# Patient Record
Sex: Female | Born: 1950 | Hispanic: Yes | Marital: Single | Smoking: Never smoker
Health system: Southern US, Community
[De-identification: ages and names within clinical notes are randomized; demographics above are authoritative.]

## PROBLEM LIST (undated history)

## (undated) DIAGNOSIS — N390 Urinary tract infection, site not specified: Secondary | ICD-10-CM

## (undated) DIAGNOSIS — F32A Depression, unspecified: Secondary | ICD-10-CM

## (undated) DIAGNOSIS — Z923 Personal history of irradiation: Secondary | ICD-10-CM

## (undated) DIAGNOSIS — I10 Essential (primary) hypertension: Secondary | ICD-10-CM

## (undated) DIAGNOSIS — C801 Malignant (primary) neoplasm, unspecified: Secondary | ICD-10-CM

## (undated) DIAGNOSIS — Z780 Asymptomatic menopausal state: Secondary | ICD-10-CM

## (undated) DIAGNOSIS — R519 Headache, unspecified: Secondary | ICD-10-CM

## (undated) DIAGNOSIS — M1990 Unspecified osteoarthritis, unspecified site: Secondary | ICD-10-CM

## (undated) DIAGNOSIS — Z853 Personal history of malignant neoplasm of breast: Secondary | ICD-10-CM

## (undated) DIAGNOSIS — F329 Major depressive disorder, single episode, unspecified: Secondary | ICD-10-CM

## (undated) DIAGNOSIS — R51 Headache: Secondary | ICD-10-CM

## (undated) DIAGNOSIS — K219 Gastro-esophageal reflux disease without esophagitis: Secondary | ICD-10-CM

## (undated) HISTORY — DX: Essential (primary) hypertension: I10

## (undated) HISTORY — DX: Unspecified osteoarthritis, unspecified site: M19.90

## (undated) HISTORY — DX: Major depressive disorder, single episode, unspecified: F32.9

## (undated) HISTORY — DX: Gastro-esophageal reflux disease without esophagitis: K21.9

## (undated) HISTORY — DX: Depression, unspecified: F32.A

## (undated) HISTORY — PX: TUBAL LIGATION: SHX77

## (undated) HISTORY — DX: Asymptomatic menopausal state: Z78.0

## (undated) HISTORY — DX: Urinary tract infection, site not specified: N39.0

---

## 2005-03-26 HISTORY — PX: COLONOSCOPY: SHX174

## 2007-04-26 ENCOUNTER — Emergency Department: Payer: Self-pay | Admitting: Emergency Medicine

## 2007-04-26 ENCOUNTER — Other Ambulatory Visit: Payer: Self-pay

## 2007-05-21 ENCOUNTER — Ambulatory Visit: Payer: Self-pay

## 2007-05-23 ENCOUNTER — Ambulatory Visit: Payer: Self-pay

## 2010-02-22 ENCOUNTER — Ambulatory Visit: Payer: Self-pay | Admitting: Family

## 2010-08-04 ENCOUNTER — Ambulatory Visit: Payer: Self-pay | Admitting: Family

## 2012-09-03 ENCOUNTER — Ambulatory Visit: Payer: Self-pay

## 2013-10-19 ENCOUNTER — Ambulatory Visit: Payer: Self-pay

## 2014-05-28 DIAGNOSIS — M1712 Unilateral primary osteoarthritis, left knee: Secondary | ICD-10-CM | POA: Insufficient documentation

## 2015-09-29 ENCOUNTER — Other Ambulatory Visit: Payer: Self-pay | Admitting: Primary Care

## 2015-09-29 DIAGNOSIS — Z1231 Encounter for screening mammogram for malignant neoplasm of breast: Secondary | ICD-10-CM

## 2015-09-29 DIAGNOSIS — Z139 Encounter for screening, unspecified: Secondary | ICD-10-CM

## 2015-10-24 ENCOUNTER — Ambulatory Visit: Payer: Self-pay

## 2015-10-25 ENCOUNTER — Ambulatory Visit
Admission: RE | Admit: 2015-10-25 | Discharge: 2015-10-25 | Disposition: A | Discharge status: Home / Self Care | Payer: Medicare Other | Source: Ambulatory Visit | Attending: Primary Care | Admitting: Primary Care

## 2015-10-25 ENCOUNTER — Other Ambulatory Visit: Payer: Self-pay | Admitting: Primary Care

## 2015-10-25 DIAGNOSIS — R928 Other abnormal and inconclusive findings on diagnostic imaging of breast: Secondary | ICD-10-CM | POA: Diagnosis not present

## 2015-10-25 DIAGNOSIS — M810 Age-related osteoporosis without current pathological fracture: Secondary | ICD-10-CM | POA: Insufficient documentation

## 2015-10-25 DIAGNOSIS — Z780 Asymptomatic menopausal state: Secondary | ICD-10-CM | POA: Diagnosis not present

## 2015-10-25 DIAGNOSIS — Z1231 Encounter for screening mammogram for malignant neoplasm of breast: Secondary | ICD-10-CM | POA: Insufficient documentation

## 2015-10-25 DIAGNOSIS — Z139 Encounter for screening, unspecified: Secondary | ICD-10-CM | POA: Diagnosis present

## 2015-10-25 DIAGNOSIS — Z13820 Encounter for screening for osteoporosis: Secondary | ICD-10-CM | POA: Diagnosis not present

## 2015-10-26 ENCOUNTER — Other Ambulatory Visit: Payer: Self-pay | Admitting: Primary Care

## 2015-10-26 DIAGNOSIS — N6320 Unspecified lump in the left breast, unspecified quadrant: Secondary | ICD-10-CM

## 2015-11-01 ENCOUNTER — Ambulatory Visit
Admission: RE | Admit: 2015-11-01 | Discharge: 2015-11-01 | Disposition: A | Discharge status: Home / Self Care | Payer: Medicare Other | Source: Ambulatory Visit | Attending: Primary Care | Admitting: Primary Care

## 2015-11-01 DIAGNOSIS — R928 Other abnormal and inconclusive findings on diagnostic imaging of breast: Secondary | ICD-10-CM | POA: Diagnosis present

## 2015-11-01 DIAGNOSIS — N6320 Unspecified lump in the left breast, unspecified quadrant: Secondary | ICD-10-CM

## 2015-11-04 ENCOUNTER — Other Ambulatory Visit: Payer: Self-pay | Admitting: Family Medicine

## 2015-11-04 DIAGNOSIS — N6320 Unspecified lump in the left breast, unspecified quadrant: Secondary | ICD-10-CM

## 2015-11-09 ENCOUNTER — Ambulatory Visit
Admission: RE | Admit: 2015-11-09 | Discharge: 2015-11-09 | Disposition: A | Discharge status: Home / Self Care | Payer: Medicare Other | Source: Ambulatory Visit | Attending: Family Medicine | Admitting: Family Medicine

## 2015-11-09 DIAGNOSIS — N6320 Unspecified lump in the left breast, unspecified quadrant: Secondary | ICD-10-CM

## 2015-11-09 DIAGNOSIS — C50912 Malignant neoplasm of unspecified site of left female breast: Secondary | ICD-10-CM | POA: Diagnosis not present

## 2015-11-09 DIAGNOSIS — N63 Unspecified lump in breast: Secondary | ICD-10-CM | POA: Diagnosis present

## 2015-11-15 NOTE — Progress Notes (Signed)
  Oncology Nurse Navigator Documentation  Navigator Location: [facility]-Med Onc (11/15/15 0900) Navigator Encounter Type: Introductory phone call;Telephone (11/15/15 0900)   Abnormal Finding Date: 11/01/15 (11/15/15 0900) Confirmed Diagnosis Date: 11/09/15 (11/15/15 0900)     Patient Visit Type: Initial (11/15/15 0900) Treatment Phase: Pre-Tx/Tx Discussion (11/15/15 0900) Barriers/Navigation Needs: Coordination of Care (Language) (11/15/15 0900)   Interventions: Education Method;Coordination of Care;Referrals (Interpreter) (11/15/15 0900)                      Time Spent with Patient: 75 (11/15/15 0900)  Newly diagnosed patient. Via interpreter scheduled Med Onc/surgical consult for 11/17/15.  Spoke with son who will accompany patient to appointments.  Maritza Afandor and [employer] interpreted phone calls.

## 2015-11-16 DIAGNOSIS — C50412 Malignant neoplasm of upper-outer quadrant of left female breast: Secondary | ICD-10-CM | POA: Insufficient documentation

## 2015-11-16 NOTE — Progress Notes (Signed)
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  INTERVAL HISTORY: Patient is a 65 year old female who was noted to have an abnormality on routine screening mammogram. Subsequent ultrasound and biopsy revealed the above stated risk cancer. Patient is Spanish-speaking only and the entire visit was done and presence of an interpreter. She currently feels well and is asymptomatic. She has some residual breast tenderness from her biopsy. She has no neurologic complaints. She denies any recent fevers or illnesses. She has a good appetite and denies weight loss. She denies any chest pain or shortness of breath. She denies any nausea, vomiting, constipation, or diarrhea. She has no urinary complaints. Patient otherwise feels well and offers no further specific complaints.  REVIEW OF SYSTEMS:   Review of Systems  Constitutional: Negative.  Negative for fever, malaise/fatigue and weight loss.  Respiratory: Negative.  Negative for cough and shortness of breath.   Cardiovascular: Negative.  Negative for chest pain.  Gastrointestinal: Negative.  Negative for abdominal pain.  Genitourinary: Negative.   Musculoskeletal: Negative.   Neurological: Negative.  Negative for weakness.  Psychiatric/Behavioral: Negative.     As per HPI. Otherwise, a complete review of systems is negatve.  PAST MEDICAL HISTORY: Past Medical History:  Diagnosis Date  . Arthritis   . Depression   . GERD (gastroesophageal reflux disease)   . Hypertension   . Post-menopausal   . UTI (urinary tract infection)     PAST SURGICAL HISTORY: Past Surgical History:  Procedure Laterality Date  . COLONOSCOPY  2007  . TUBAL  LIGATION      FAMILY HISTORY: Reviewed and unchanged. No reported history of malignancy or chronic disease.     ADVANCED DIRECTIVES (Y/N):  N   HEALTH MAINTENANCE: Social History  Substance Use Topics  . Smoking status: Never Smoker  . Smokeless tobacco: Not on file  . Alcohol use Not on file     Colonoscopy:  PAP:  Bone density:  Lipid panel:  Allergies not on file  Current Outpatient Prescriptions  Medication Sig Dispense Refill  . bisoprolol-hydrochlorothiazide (ZIAC) 10-6.25 MG tablet Take 1 tablet by mouth 2 (two) times daily.    [name] ibuprofen (ADVIL,MOTRIN) 200 MG tablet Take 1 tablet by mouth every 8 (eight) hours as needed.    . simvastatin (ZOCOR) 20 MG tablet Take 1 tablet by mouth daily.     No current facility-administered medications for this visit.     OBJECTIVE: Vitals:   11/17/15 0845  BP: (!) 150/89  Pulse: 80  Resp: 18  Temp: (!) 96.6 F (35.9 C)     Body mass index is 32.09 kg/m.    ECOG FS:0 - Asymptomatic  General: Well-developed, well-nourished, no acute distress. Eyes: Pink conjunctiva, anicteric sclera. HEENT: Normocephalic, moist mucous membranes, clear oropharnyx. Breasts: Patient requested exam be deferred today. Lungs: Clear to auscultation bilaterally. Heart: Regular rate and rhythm. No rubs, murmurs, or gallops. Abdomen: Soft, nontender, nondistended. No organomegaly noted, normoactive bowel sounds. Musculoskeletal: No edema, cyanosis, or clubbing. Neuro: Alert, answering all questions appropriately. Cranial nerves grossly intact. Skin: No rashes or petechiae noted. Psych: Normal affect. Lymphatics: No cervical, calvicular, axillary or inguinal LAD.   LAB RESULTS:  No results found for: NA, K, CL, CO2, GLUCOSE,  BUN, CREATININE, CALCIUM, PROT, ALBUMIN, AST, ALT, ALKPHOS, BILITOT, GFRNONAA, GFRAA  No results found for: WBC, NEUTROABS, HGB, HCT, MCV, PLT   STUDIES: Dg Bone Density  Result Date: 10/25/2015 EXAM: DUAL X-RAY  ABSORPTIOMETRY (DXA) FOR BONE MINERAL DENSITY IMPRESSION: Dear Dr. Freddy Finner, Your patient Jasmine Gardner completed a BMD test on 10/25/2015 using the [place] (analysis version: 14.10) manufactured by [employer]. The following summarizes the results of our evaluation. PATIENT BIOGRAPHICAL: Name: Jasmine, Gardner Patient ID: [id number] Birth Date: 14-Mar-1951 Height: 60.0 in. Gender: Female Exam Date: 10/25/2015 Weight: 163.1 lbs. Indications: Postmenopausal Fractures: Treatments: CALCIUM VIT D ASSESSMENT: The BMD measured at AP Spine L1-L4 is 0.870 g/cm2 with a T-score of -2.6. This patient is considered osteoporotic according to [facility]) criteria. Site Region Measured Measured WHO Young Adult BMD Date       Age      Classification T-score AP Spine L1-L4 10/25/[age] 66.2 Osteoporosis -2.6 0.870 g/cm2 DualFemur Neck Right 10/25/2015 66.3 Osteopenia -1.4 0.839 g/cm2 World Health Organization [facility]) criteria for post-menopausal, Caucasian Women: Normal:       T-score at or above -1 SD Osteopenia:   T-score between -1 and -2.5 SD Osteoporosis: T-score at or below -2.5 SD RECOMMENDATIONS: [place] recommends that FDA-approved medical therapies be considered in postmenopausal women and men age 31 or older with a: 1. Hip or vertebral (clinical or morphometric) fracture. 2. T-score of < -2.5 at the spine or hip. 3. Ten-year fracture probability by FRAX of 3% or greater for hip fracture or 20% or greater for major osteoporotic fracture. All treatment decisions require clinical judgment and consideration of individual patient factors, including patient preferences, co-morbidities, previous drug use, risk factors not captured in the FRAX model (e.g. falls, vitamin D deficiency, increased bone turnover, interval significant decline in bone density) and possible under - or over-estimation of fracture risk by FRAX. All patients should ensure an  adequate intake of dietary calcium (1200 mg/d) and vitamin D (800 IU daily) unless contraindicated. FOLLOW-UP: People with diagnosed cases of osteoporosis or at high risk for fracture should have regular bone mineral density tests. For patients eligible for Medicare, routine testing is allowed once every 2 years. The testing frequency can be increased to one year for patients who have rapidly progressing disease, those who are receiving or discontinuing medical therapy to restore bone mass, or have additional risk factors. I have reviewed this report, and agree with the above findings. [facility] Radiology Electronically Signed   By: Nolon Nations M.D.   On: 10/25/2015 09:54   US Breast Ltd Uni Left Inc Axilla  Result Date: 11/01/2015 CLINICAL DATA:  Evaluate left breast mass noted on mammography. EXAM: ULTRASOUND OF THE LEFT BREAST COMPARISON:  Previous exam(s). FINDINGS: On physical exam, there is no discrete palpable abnormality in the area of mammographic concern. Targeted ultrasound is performed, showing a round, slightly irregular hypoechoic mass located within the left breast at 2:30 o'clock position 8 cm from the nipple measuring 8 x 8 x 8 mm in size. This is a suspicious mass and tissue sampling via ultrasound-guided core biopsy is recommended. Ultrasound of the left axilla demonstrates normal axillary contents and no evidence for adenopathy. IMPRESSION: 8 mm suspicious mass located within the left breast at the 2:30 o'clock position 8 cm from the nipple. Ultrasound-guided core biopsy is recommended. RECOMMENDATION: Left breast ultrasound-guided core biopsy. I have discussed the findings and recommendations with the patient. Results were also provided in  writing at the conclusion of the visit. If applicable, a reminder letter will be sent to the patient regarding the next appointment. BI-RADS CATEGORY  4: Suspicious. Electronically Signed   By: Altamese Cabal M.D.   On: 11/01/2015 12:07   Mm  Screening Breast Tomo Bilateral  Result Date: 10/25/2015 CLINICAL DATA:  Screening. EXAM: 2D DIGITAL SCREENING BILATERAL MAMMOGRAM WITH CAD AND ADJUNCT TOMO COMPARISON:  Previous exam(s). ACR Breast Density Category b: There are scattered areas of fibroglandular density. FINDINGS: In the left breast, a possible mass warrants further evaluation. In the right breast, no findings suspicious for malignancy. Images were processed with CAD. IMPRESSION: Further evaluation is suggested for possible mass in the left breast. RECOMMENDATION: Ultrasound of the left breast. (Code:[id number]-L-39M) The patient will be contacted regarding the findings, and additional imaging will be scheduled. BI-RADS CATEGORY  0: Incomplete. Need additional imaging evaluation and/or prior mammograms for comparison. Electronically Signed   By: Abelardo Diesel M.D.   On: 10/25/2015 13:27   Mm Clip Placement Left  Result Date: 11/09/2015 CLINICAL DATA:  Status post ultrasound-guided left breast biopsy. EXAM: DIAGNOSTIC LEFT MAMMOGRAM POST ULTRASOUND BIOPSY COMPARISON:  Previous exam(s). FINDINGS: Mammographic images were obtained following ultrasound guided biopsy of an indeterminate left breast mass at 2:30, 8 cm from the nipple. Post biopsy mammogram demonstrates the coil shaped biopsy marker to be within the mass in the upper, outer left breast. IMPRESSION: Appropriate marker position as above. Final Assessment: Post Procedure Mammograms for Marker Placement Electronically Signed   By: Pamelia Hoit M.D.   On: 11/09/2015 08:14   [place] Lt Breast Bx W Loc Dev 1st Lesion Img Bx Spec US Guide  Addendum Date: 11/15/2015   ADDENDUM REPORT: 11/15/2015 11:32 ADDENDUM: Pathology of the left breast biopsy revealed LEFT BREAST, 2:30, 8 CMFN; BIOPSY: INVASIVE MAMMARY CARCINOMA OF NO SPECIAL TYPE. PRELIMINARY GRADE: 2 ([place] HISTOLOGIC GRADE). Note: ER, PR and HER2-neu immunohistochemistry is obtained and results will be issued in an addendum. HER2 FISH will be  performed for equivocal results. Final histologic grade should be based on the excised tumor. Results were called to [facility] in the Radiology Dept. at 236 pm on 11/10/15. This was found to be concordant by Dr. Brigitte Pulse. Recommendations:  Surgical and oncology referral. At the patient's request, pathology and recommendations were relayed to the patient by Dr. Theda Sers with the [facility] interpreter. The patient stated she had done well following the biopsy. All of her questions were answered. She was informed that the nurse navigators from [facility] would contact her with referral information. She was encouraged to call the [facility] with any further questions or concerns. Request for referral was relayed to Al Pimple, RN, nurse navigator by Jetta Lout, [place] on 11/11/15. An oncology appointment was made with Dr. Grayland Ormond on 11/17/15 at 8:30 AM and surgical appointment with Dr. Tamala Julian for 11/17/15 at 12:45 PM. The patient has been notified with referral information. Addendum by Jetta Lout, RRA on 11/15/15. Electronically Signed   By: Ammie Ferrier M.D.   On: 11/15/2015 11:32   Result Date: 11/15/2015 CLINICAL DATA:  65 year old female for ultrasound-guided left breast biopsy EXAM: ULTRASOUND GUIDED LEFT BREAST CORE NEEDLE BIOPSY COMPARISON:  Previous exam(s). FINDINGS: I met with the patient and we discussed the procedure of ultrasound-guided biopsy, including benefits and alternatives. We discussed the high likelihood of a successful procedure. We discussed the risks of the procedure, including infection, bleeding, tissue injury, clip migration, and inadequate sampling. Informed  written consent was given. The usual time-out protocol was performed immediately prior to the procedure. Using sterile technique and 1% Lidocaine as local anesthetic, under direct ultrasound visualization, a 12 gauge spring-loaded device was used to perform biopsy of an indeterminate  left breast mass at 2:30, 8 cm from the nipple using a lateral to medial approach. At the conclusion of the procedure a coil shaped tissue marker clip was deployed into the biopsy cavity. Follow up 2 view mammogram was performed and dictated separately. IMPRESSION: Ultrasound guided biopsy of an indeterminate left breast mass at 2:30, 8 cm from the nipple. No apparent complications. Electronically Signed: By: Pamelia Hoit M.D. 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  PLAN:    1. 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 Patient has an appointment with surgery later today. It is unclear whether patient will require adjuvant chemotherapy, but Oncotype DX has been ordered from her biopsy specimen. Patient will return to clinic one to 2 weeks after her surgery to discuss the final pathology results and additional treatment planning. She will also likely have an appointment with radiation oncology at that time. Finally, given ER/PR status of the patient's tumor she will require either tamoxifen or an aromatase inhibitor for 5 years. 2. Osteoporosis: Patient's bone mineral density on October 25, 2015 revealed a T score of -2.6 which is considered osteoporotic. Although the recommendation for postmenopausal women is an aromatase inhibitor, this puts her at increased risk for osteoporosis therefore she may be placed on tamoxifen for 5 years.  Approximately 45 minutes was spent in discussion of which greater than 50% was consultation.  The entire visit was not done in the presence of an interpreter.  Patient expressed understanding and was in agreement with this plan. She also understands that She can call clinic at any time with any  questions, concerns, or complaints.   Primary cancer of upper outer quadrant of left female breast [facility])   Staging form: Breast, AJCC 7th Edition   - Clinical stage from 11/16/2015: Stage IA (T1b, N0, M0) - Signed by Lloyd Huger, MD on 11/17/2015  Lloyd Huger, MD   11/17/2015 10:33 AM

## 2015-11-17 ENCOUNTER — Encounter (INDEPENDENT_AMBULATORY_CARE_PROVIDER_SITE_OTHER): Payer: Self-pay

## 2015-11-17 ENCOUNTER — Encounter: Payer: Self-pay | Admitting: Oncology

## 2015-11-17 ENCOUNTER — Inpatient Hospital Stay: Payer: Medicare Other | Attending: Oncology | Admitting: Oncology

## 2015-11-17 DIAGNOSIS — Z170 Estrogen receptor positive status [ER+]: Secondary | ICD-10-CM | POA: Diagnosis not present

## 2015-11-17 DIAGNOSIS — C50412 Malignant neoplasm of upper-outer quadrant of left female breast: Secondary | ICD-10-CM | POA: Insufficient documentation

## 2015-11-17 DIAGNOSIS — M818 Other osteoporosis without current pathological fracture: Secondary | ICD-10-CM | POA: Insufficient documentation

## 2015-11-17 DIAGNOSIS — I10 Essential (primary) hypertension: Secondary | ICD-10-CM

## 2015-11-17 DIAGNOSIS — Z87442 Personal history of urinary calculi: Secondary | ICD-10-CM

## 2015-11-17 DIAGNOSIS — Z780 Asymptomatic menopausal state: Secondary | ICD-10-CM | POA: Diagnosis not present

## 2015-11-17 DIAGNOSIS — M129 Arthropathy, unspecified: Secondary | ICD-10-CM | POA: Diagnosis not present

## 2015-11-17 DIAGNOSIS — K219 Gastro-esophageal reflux disease without esophagitis: Secondary | ICD-10-CM | POA: Diagnosis not present

## 2015-11-17 DIAGNOSIS — F329 Major depressive disorder, single episode, unspecified: Secondary | ICD-10-CM | POA: Insufficient documentation

## 2015-11-17 DIAGNOSIS — Z79899 Other long term (current) drug therapy: Secondary | ICD-10-CM | POA: Insufficient documentation

## 2015-11-17 LAB — SURGICAL PATHOLOGY

## 2015-11-17 NOTE — Progress Notes (Signed)
New referral for breast cancer from [facility]. Offers no complaints. States is feeling well.

## 2015-11-18 ENCOUNTER — Other Ambulatory Visit: Payer: Self-pay | Admitting: Surgery

## 2015-11-18 DIAGNOSIS — C50912 Malignant neoplasm of unspecified site of left female breast: Secondary | ICD-10-CM

## 2015-11-18 DIAGNOSIS — C50412 Malignant neoplasm of upper-outer quadrant of left female breast: Secondary | ICD-10-CM

## 2015-11-21 ENCOUNTER — Encounter
Admission: RE | Admit: 2015-11-21 | Discharge: 2015-11-21 | Disposition: A | Discharge status: Home / Self Care | Payer: Medicare Other | Source: Ambulatory Visit | Attending: Surgery | Admitting: Surgery

## 2015-11-21 DIAGNOSIS — Z01810 Encounter for preprocedural cardiovascular examination: Secondary | ICD-10-CM | POA: Insufficient documentation

## 2015-11-21 DIAGNOSIS — C50412 Malignant neoplasm of upper-outer quadrant of left female breast: Secondary | ICD-10-CM | POA: Insufficient documentation

## 2015-11-21 HISTORY — DX: Headache: R51

## 2015-11-21 HISTORY — DX: Headache, unspecified: R51.9

## 2015-11-21 NOTE — Patient Instructions (Signed)
Your procedure is scheduled on: Friday 11/25/15 Su procedimiento est programado para: Report to [place] [facility].   Remember: Instructions that are not followed completely may result in serious medical risk, up to and including death, or upon the discretion of your surgeon and anesthesiologist your surgery may need to be rescheduled.  Recuerde: Las instrucciones que no se siguen completamente Heritage manager en un riesgo de salud grave, incluyendo hasta la [place] o a discrecin de su cirujano y Environmental health practitioner, su ciruga se puede posponer.   __X__ 1. Do not eat food or drink liquids after midnight. No gum chewing or hard candies.  No coma alimentos ni tome lquidos despus de la medianoche.  No mastique chicle ni caramelos  duros.     __X__ 2. No alcohol for 24 hours before or after surgery.    No tome alcohol durante las 24 horas antes ni despus de la [place].   ____ 3. Bring all medications with you on the day of surgery if instructed.    Lleve todos los medicamentos con usted el da de su ciruga si se le ha indicado as.   __X__ 4. Notify your doctor if there is any change in your medical condition (cold, fever,                             infections).    Informe a su mdico si hay algn cambio en su condicin mdica (resfriado, fiebre, infecciones).   Do not wear jewelry, make-up, hairpins, clips or nail polish.  No use joyas, maquillajes, pinzas/ganchos para el cabello ni esmalte de uas.  Do not wear lotions, powders, or perfumes. .  No use lociones, polvos o perfumes.  .    Do not shave 48 hours prior to surgery. Men may shave face and neck.  No se afeite 48 horas antes de la [place].  Los hombres pueden [employer] cara y el cuello.   Do not bring valuables to the hospital.   No lleve objetos [place] is not responsible for any belongings or valuables.  [place] no se hace responsable de ningn tipo de pertenencias u  objetos de Geographical information systems officer.               Contacts, dentures or bridgework may not be worn into surgery.  Los lentes de [place], las dentaduras postizas o puentes no se pueden usar en la [place].  Leave your suitcase in the car. After surgery it may be brought to your room.  Deje su maleta en el auto.  Despus de la ciruga podr traerla a su habitacin.  For patients admitted to the hospital, discharge time is determined by your treatment team.  Para los pacientes que sean ingresados al hospital, el tiempo en el cual se le dar de alta es determinado por su                equipo de [place].   Patients discharged the day of surgery will not be allowed to drive home. A los pacientes que se les da de alta el mismo da de la ciruga no se les permitir conducir a Holiday representative.   Please read over the following fact sheets that you were given: Por favor [place] informacin que le dieron:   Surgical Site Infection Prevention   __X__ Take these medicines the morning of surgery with A SIP OF WATER:  Tome estas medicinas la maana de la ciruga con UN SORBO DE AGUA:  1. AMLODIPINE  2. LORATADINE  3. SERTRALINE  4.       5.  6.  ____ Fleet Enema (as directed)          Enema de [place] (segn lo indicado)    __X__ Use CHG Soap as directed          Utilice el jabn de CHG segn lo indicado  ____ Use inhalers on the day of surgery          Use los inhaladores el da de la ciruga  ____ Stop metformin 2 days prior to surgery          Deje de tomar el metformin 2 das antes de la ciruga    ____ Take 1/2 of usual insulin dose the night before surgery and none on the morning of surgery           Tome la mitad de la dosis habitual de insulina la noche antes de la [place] y no tome nada en la maana de la             ciruga  ____ Stop Coumadin/Plavix/aspirin on           Deje de tomar el Coumadin/Plavix/aspirina el da:  __X__ Stop Anti-inflammatories on TODAY IBUPROFEN,  ADVIL, ALEVE          Deje de tomar antiinflamatorios el da:   ____ Stop supplements until after surgery            Deje de tomar suplementos hasta despus de la ciruga  ____ Bring C-Pap to the hospital          [place] al hospital

## 2015-11-22 NOTE — Progress Notes (Signed)
  Oncology Nurse Navigator Documentation  Navigator Location: [facility]-Med Onc (11/17/15 0920) Navigator Encounter Type: Initial MedOnc (11/17/15 0920)           Patient Visit Type: MedOnc;Initial (11/17/15 0920) Treatment Phase: Pre-Tx/Tx Discussion (11/17/15 0920) Barriers/Navigation Needs: Coordination of Care;Education;Family concerns (11/17/15 0920) Education: Understanding Cancer/ Treatment Options;Coping with Diagnosis/ Prognosis;Preparing for Upcoming Surgery/ Treatment;Newly Diagnosed Cancer Education;Transport During Treatment (11/17/15 0920) Interventions: Education Method (11/17/15 0920)                      Time Spent with Patient: 90 (11/17/15 0920)  Met patient and son at initial Med Onc visit with Dr. Cheri Rous Afanador interpreted.  Son requests transportation/van assist when patient begins radiation.  To call back when surgery scheduled to schedule follow-up appointment with Dr. Adelfa Koh Dr. Baruch Gouty.

## 2015-11-22 NOTE — Progress Notes (Signed)
  Oncology Nurse Navigator Documentation  Navigator Location: [facility] Onc (11/22/15 1500) Navigator Encounter Type: Telephone (11/22/15 1500)               Barriers/Navigation Needs: Coordination of Care (11/22/15 1500)                          Time Spent with Patient: 30 (11/22/15 1500)     Patient is scheduled for surgery on 11/25/15.  Worked with team Finnegan/Chrystal to schedule follow-up in [place]. Patient is scheduled for consult with Dr. Baruch Gouty on 12/12/15 at 1:30, and follow-up with Dr.Finnegan at  2:15.  Notified patient, and requested translation for mailed appointment information.

## 2015-11-25 ENCOUNTER — Ambulatory Visit
Admission: RE | Admit: 2015-11-25 | Discharge: 2015-11-25 | Disposition: A | Discharge status: Home / Self Care | Payer: Medicare Other | Source: Ambulatory Visit | Attending: Surgery | Admitting: Surgery

## 2015-11-25 ENCOUNTER — Ambulatory Visit: Payer: Medicare Other | Admitting: Anesthesiology

## 2015-11-25 ENCOUNTER — Encounter
Admission: RE | Disposition: A | Discharge status: Home / Self Care | Payer: Self-pay | Source: Ambulatory Visit | Attending: Surgery

## 2015-11-25 DIAGNOSIS — C50912 Malignant neoplasm of unspecified site of left female breast: Secondary | ICD-10-CM | POA: Diagnosis present

## 2015-11-25 DIAGNOSIS — Z6832 Body mass index (BMI) 32.0-32.9, adult: Secondary | ICD-10-CM | POA: Diagnosis not present

## 2015-11-25 DIAGNOSIS — Z79899 Other long term (current) drug therapy: Secondary | ICD-10-CM | POA: Diagnosis not present

## 2015-11-25 DIAGNOSIS — D0512 Intraductal carcinoma in situ of left breast: Secondary | ICD-10-CM | POA: Insufficient documentation

## 2015-11-25 DIAGNOSIS — Z170 Estrogen receptor positive status [ER+]: Secondary | ICD-10-CM | POA: Diagnosis not present

## 2015-11-25 DIAGNOSIS — C50412 Malignant neoplasm of upper-outer quadrant of left female breast: Secondary | ICD-10-CM

## 2015-11-25 DIAGNOSIS — G43909 Migraine, unspecified, not intractable, without status migrainosus: Secondary | ICD-10-CM | POA: Insufficient documentation

## 2015-11-25 DIAGNOSIS — E669 Obesity, unspecified: Secondary | ICD-10-CM | POA: Insufficient documentation

## 2015-11-25 DIAGNOSIS — I10 Essential (primary) hypertension: Secondary | ICD-10-CM | POA: Diagnosis not present

## 2015-11-25 HISTORY — PX: SENTINEL NODE BIOPSY: SHX6608

## 2015-11-25 HISTORY — PX: PARTIAL MASTECTOMY WITH NEEDLE LOCALIZATION: SHX6008

## 2015-11-25 SURGERY — PARTIAL MASTECTOMY WITH NEEDLE LOCALIZATION
Anesthesia: General | Laterality: Left

## 2015-11-25 MED ORDER — FAMOTIDINE 20 MG PO TABS
ORAL_TABLET | ORAL | Status: AC
  Filled 2015-11-25: qty 1

## 2015-11-25 MED ORDER — OXYCODONE HCL 5 MG PO TABS: 5.0000 mg | ORAL_TABLET | Freq: Once | ORAL | Status: DC | PRN

## 2015-11-25 MED ORDER — BUPIVACAINE-EPINEPHRINE 0.5% -1:200000 IJ SOLN
INTRAMUSCULAR | Status: DC | PRN
  Administered 2015-11-25: 18 mL

## 2015-11-25 MED ORDER — DEXAMETHASONE SODIUM PHOSPHATE 10 MG/ML IJ SOLN
INTRAMUSCULAR | Status: DC | PRN
  Administered 2015-11-25: 10 mg via INTRAVENOUS

## 2015-11-25 MED ORDER — FENTANYL CITRATE (PF) 100 MCG/2ML IJ SOLN
25.0000 ug | INTRAMUSCULAR | Status: DC | PRN
  Administered 2015-11-25 (×3): 50 ug via INTRAVENOUS

## 2015-11-25 MED ORDER — BUPIVACAINE-EPINEPHRINE (PF) 0.5% -1:200000 IJ SOLN
INTRAMUSCULAR | Status: AC
  Filled 2015-11-25: qty 30

## 2015-11-25 MED ORDER — FAMOTIDINE 20 MG PO TABS
20.0000 mg | ORAL_TABLET | Freq: Once | ORAL | Status: AC
  Administered 2015-11-25: 20 mg via ORAL

## 2015-11-25 MED ORDER — PROMETHAZINE HCL 25 MG/ML IJ SOLN: 6.2500 mg | INTRAMUSCULAR | Status: DC | PRN

## 2015-11-25 MED ORDER — OXYCODONE HCL 5 MG/5ML PO SOLN: 5.0000 mg | Freq: Once | ORAL | Status: DC | PRN

## 2015-11-25 MED ORDER — TECHNETIUM TC 99M SULFUR COLLOID
1.0000 | Freq: Once | INTRAVENOUS | Status: AC | PRN
  Administered 2015-11-25: 1.15 via INTRAVENOUS

## 2015-11-25 MED ORDER — MIDAZOLAM HCL 2 MG/2ML IJ SOLN
INTRAMUSCULAR | Status: DC | PRN
  Administered 2015-11-25: 2 mg via INTRAVENOUS

## 2015-11-25 MED ORDER — MEPERIDINE HCL 25 MG/ML IJ SOLN: 6.2500 mg | INTRAMUSCULAR | Status: DC | PRN

## 2015-11-25 MED ORDER — FENTANYL CITRATE (PF) 100 MCG/2ML IJ SOLN
INTRAMUSCULAR | Status: DC
Start: 2015-11-25 — End: 2015-11-25
  Filled 2015-11-25: qty 2

## 2015-11-25 MED ORDER — ONDANSETRON HCL 4 MG/2ML IJ SOLN
INTRAMUSCULAR | Status: DC | PRN
  Administered 2015-11-25: 4 mg via INTRAVENOUS

## 2015-11-25 MED ORDER — FENTANYL CITRATE (PF) 100 MCG/2ML IJ SOLN
INTRAMUSCULAR | Status: DC | PRN
  Administered 2015-11-25 (×2): 50 ug via INTRAVENOUS

## 2015-11-25 MED ORDER — FENTANYL CITRATE (PF) 100 MCG/2ML IJ SOLN
INTRAMUSCULAR | Status: AC
  Filled 2015-11-25: qty 2

## 2015-11-25 MED ORDER — LIDOCAINE HCL (CARDIAC) 20 MG/ML IV SOLN
INTRAVENOUS | Status: DC | PRN
  Administered 2015-11-25: 80 mg via INTRAVENOUS

## 2015-11-25 MED ORDER — PROPOFOL 10 MG/ML IV BOLUS
INTRAVENOUS | Status: DC | PRN
  Administered 2015-11-25: 140 mg via INTRAVENOUS

## 2015-11-25 MED ORDER — LACTATED RINGERS IV SOLN
INTRAVENOUS | Status: DC
  Administered 2015-11-25: 10:00:00 via INTRAVENOUS

## 2015-11-25 MED ORDER — HYDROCODONE-ACETAMINOPHEN 5-325 MG PO TABS: 1.0000 | ORAL_TABLET | ORAL | 0 refills | Status: DC | PRN

## 2015-11-25 MED ORDER — PHENYLEPHRINE HCL 10 MG/ML IJ SOLN
INTRAMUSCULAR | Status: DC | PRN
  Administered 2015-11-25 (×3): 100 ug via INTRAVENOUS
  Administered 2015-11-25: 200 ug via INTRAVENOUS
  Administered 2015-11-25 (×3): 100 ug via INTRAVENOUS

## 2015-11-25 MED ORDER — GLYCOPYRROLATE 0.2 MG/ML IJ SOLN
INTRAMUSCULAR | Status: DC | PRN
  Administered 2015-11-25: 0.2 mg via INTRAVENOUS

## 2015-11-25 MED ORDER — SODIUM CHLORIDE 0.9 % IV SOLN
INTRAVENOUS | Status: DC | PRN
  Administered 2015-11-25: 30 ug/min via INTRAVENOUS

## 2015-11-25 SURGICAL SUPPLY — 32 items
BLADE SURG 15 STRL LF DISP TIS (BLADE) ×1 IMPLANT
BLADE SURG 15 STRL SS (BLADE) ×2
CANISTER SUCT 1200ML W/VALVE (MISCELLANEOUS) ×3 IMPLANT
CHLORAPREP W/TINT 26ML (MISCELLANEOUS) ×3 IMPLANT
CNTNR SPEC 2.5X3XGRAD LEK (MISCELLANEOUS) ×2
CONT SPEC 4OZ STER OR WHT (MISCELLANEOUS) ×4
CONTAINER SPEC 2.5X3XGRAD LEK (MISCELLANEOUS) ×2 IMPLANT
DEVICE DUBIN SPECIMEN MAMMOGRA (MISCELLANEOUS) ×3 IMPLANT
DRAPE LAPAROTOMY 77X122 PED (DRAPES) ×3 IMPLANT
ELECT REM PT RETURN 9FT ADLT (ELECTROSURGICAL) ×3
ELECTRODE REM PT RTRN 9FT ADLT (ELECTROSURGICAL) ×1 IMPLANT
GLOVE BIO SURGEON STRL SZ7.5 (GLOVE) ×3 IMPLANT
GOWN STRL REUS W/ TWL LRG LVL3 (GOWN DISPOSABLE) ×2 IMPLANT
GOWN STRL REUS W/TWL LRG LVL3 (GOWN DISPOSABLE) ×4
KIT RM TURNOVER STRD PROC AR (KITS) ×3 IMPLANT
LABEL OR SOLS (LABEL) ×3 IMPLANT
LIQUID BAND (GAUZE/BANDAGES/DRESSINGS) ×3 IMPLANT
MARGIN MAP 10MM (MISCELLANEOUS) ×3 IMPLANT
NDL SAFETY 18GX1.5 (NEEDLE) ×3 IMPLANT
NDL SAFETY 22GX1.5 (NEEDLE) ×3 IMPLANT
NEEDLE HYPO 25X1 1.5 SAFETY (NEEDLE) ×3 IMPLANT
PACK BASIN MINOR ARMC (MISCELLANEOUS) ×3 IMPLANT
SLEVE PROBE SENORX GAMMA FIND (MISCELLANEOUS) ×3 IMPLANT
SUT CHROMIC 3 0 SH 27 (SUTURE) ×3 IMPLANT
SUT CHROMIC 4 0 RB 1X27 (SUTURE) ×3 IMPLANT
SUT ETHILON 3-0 FS-10 30 BLK (SUTURE) ×3
SUT MNCRL 4-0 (SUTURE) ×2
SUT MNCRL 4-0 27XMFL (SUTURE) ×1
SUTURE EHLN 3-0 FS-10 30 BLK (SUTURE) ×1 IMPLANT
SUTURE MNCRL 4-0 27XMF (SUTURE) ×1 IMPLANT
SYRINGE 10CC LL (SYRINGE) ×3 IMPLANT
WATER STERILE IRR 1000ML POUR (IV SOLUTION) ×3 IMPLANT

## 2015-11-25 NOTE — Anesthesia Postprocedure Evaluation (Signed)
Anesthesia Post Note  Patient: KRISTY-LEE FLOHR  Procedure(s) Performed: Procedure(s) (LRB): PARTIAL MASTECTOMY WITH NEEDLE LOCALIZATION (Left) SENTINEL NODE BIOPSY (Left)  Patient location during evaluation: PACU Anesthesia Type: General Level of consciousness: awake and alert and oriented Pain management: pain level controlled Vital Signs Assessment: post-procedure vital signs reviewed and stable Respiratory status: spontaneous breathing, nonlabored ventilation and respiratory function stable Cardiovascular status: blood pressure returned to baseline and stable Postop Assessment: no signs of nausea or vomiting Anesthetic complications: no    Last Vitals:  Vitals:   11/25/15 1308 11/25/15 1318  BP: 118/71 123/77  Pulse: 74 81  Resp: (!) 9 10  Temp:      Last Pain:  Vitals:   11/25/15 1305  TempSrc:   PainSc: 6                  Danyeal Akens

## 2015-11-25 NOTE — Progress Notes (Signed)
Interpreter here to go over instructions

## 2015-11-25 NOTE — Anesthesia Preprocedure Evaluation (Signed)
Anesthesia Evaluation  Patient identified by MRN, date of birth, ID band Patient awake    Reviewed: Allergy & Precautions, NPO status , Patient's Chart, lab work & pertinent test results  History of Anesthesia Complications Negative for: history of anesthetic complications  Airway Mallampati: II  TM Distance: >3 FB Neck ROM: Full    Dental  (+) Upper Dentures, Poor Dentition   Pulmonary neg pulmonary ROS, neg sleep apnea, neg COPD,    breath sounds clear to auscultation- rhonchi (-) wheezing      Cardiovascular hypertension, Pt. on medications and Pt. on home beta blockers (-) CAD and (-) Past MI  Rhythm:Regular Rate:Normal - Systolic murmurs and - Diastolic murmurs    Neuro/Psych  Headaches, Depression    GI/Hepatic Neg liver ROS, GERD  ,  Endo/Other  negative endocrine ROSneg diabetes  Renal/GU negative Renal ROS     Musculoskeletal  (+) Arthritis ,   Abdominal (+) + obese,   Peds  Hematology negative hematology ROS (+)   Anesthesia Other Findings Past Medical History: No date: Arthritis No date: Depression No date: GERD (gastroesophageal reflux disease) No date: Headache No date: Hypertension No date: Post-menopausal No date: UTI (urinary tract infection)   Reproductive/Obstetrics                             Anesthesia Physical Anesthesia Plan  ASA: II  Anesthesia Plan: General   Post-op Pain Management:    Induction: Intravenous  Airway Management Planned: LMA  Additional Equipment:   Intra-op Plan:   Post-operative Plan:   Informed Consent: I have reviewed the patients History and Physical, chart, labs and discussed the procedure including the risks, benefits and alternatives for the proposed anesthesia with the patient or authorized representative who has indicated his/her understanding and acceptance.   Dental advisory given  Plan Discussed with: CRNA and  Anesthesiologist  Anesthesia Plan Comments:         Anesthesia Quick Evaluation

## 2015-11-25 NOTE — Anesthesia Procedure Notes (Signed)
Procedure Name: LMA Insertion Date/Time: 11/25/2015 10:28 AM Performed by: Silvana Newness Pre-anesthesia Checklist: Patient identified, Emergency Drugs available, Suction available, Patient being monitored and Timeout performed Patient Re-evaluated:Patient Re-evaluated prior to inductionOxygen Delivery Method: Circle system utilized Preoxygenation: Pre-oxygenation with 100% oxygen Intubation Type: IV induction Ventilation: Mask ventilation without difficulty LMA: LMA inserted LMA Size: 4.0 Number of attempts: 1 Placement Confirmation: positive ETCO2 and breath sounds checked- equal and bilateral Tube secured with: Tape Dental Injury: Teeth and Oropharynx as per pre-operative assessment

## 2015-11-25 NOTE — Op Note (Signed)
OPERATIVE REPORT  PREOPERATIVE  DIAGNOSIS: . Left breast cancer  POSTOPERATIVE DIAGNOSIS: . Left breast cancer  PROCEDURE: . Left partial mastectomy with axillary sentinel lymph node biopsy  ANESTHESIA:  General  SURGEON: Rochel Brome  MD   INDICATIONS: . She had recent mammogram findings of a mass in the lateral aspect of the left breast. Ultrasound guided core biopsy demonstrated infiltrating mammary carcinoma. Surgery was recommended for definitive treatment. She did have preoperative x-ray needle localization. I reviewed the final mammogram images prior to incision. She also had injection of radioactive technetium sulfur colloid.  The patient was placed on the operating table in the supine position under general anesthesia. The left arm was placed on a lateral arm support. The dressing was removed from the lateral aspect of the left breast exposing the Kopan's wire which was cut 2 cm from the skin. The breast and axilla and surrounding chest wall and upper arm were prepared with ChloraPrep and draped in a sterile manner.  A curvilinear incision was made in the upper outer aspect of the left breast approximately 10 cm from the nipple from the 1:00 to 2:00 position. This incision was carried down through subcutaneous tissues to encounter the wire. Small bleeding points were cauterized during the course of the procedure. A mass of tissue surrounding the wire was excised which was approximately 3 x 3 x 4 cm in dimension. This was marked with the margin maps suturing markers to the medial lateral cranial caudal and deep margins. This was submitted fresh for specimen mammogram and pathology. The wound was inspected and several bleeding points cauterized and hemostasis was subsequently intact.  The axilla was probed with a gamma counter demonstrating location of radioactivity. An oblique incision was made in the inferior aspect of the axilla some 5 cm in length and carried down through subcutaneous  tissues and dissected down adjacent to the chest wall. There was a finding of radioactivity and a lymph node and surrounding fatty tissue was excised. The ex vivo count was in the range of 12-20 counts per second.This was submitted as sentinel lymph node #1. 3 clamped bleeding points were suture ligated with 3-0 chromic.  There was another finding of a area of radioactivity and resected another lymph node. The ex vivo count was in the range of 6-12 counts per second and submitted as sentinel lymph node #2 for routine pathology. This second lymph node did contain a suture ligature. Another clamped vessel was suture ligated with 3-0 chromic the wound was inspected and several tiny bleeding points cauterized and hemostasis was subsequently intact.  The radiologist reported that the mass was seen in the specimen mammogram along with the biopsy marker. The pathologist later called to report that the margins appeared to be satisfactory.  The partial mastectomy wound was further inspected and the subcutaneous tissues were infiltrated with half percent Sensorcaine with epinephrine. The wound was closed with a running 4-0 Monocryl subcuticular suture.  The axillary wound was inspected and the subcutaneous tissues were infiltrated with half percent Sensorcaine with epinephrine. The wound was closed with a running 4-0 Monocryl subcutaneous for suture.  Both wounds were then treated with LiquiBand and allowed to dry.

## 2015-11-25 NOTE — Progress Notes (Signed)
  Oncology Nurse Navigator Documentation            Surgery Date: 11/25/15 (11/25/15 1000)   Patient Visit Type: Surgery (pre-op) (11/25/15 1000) Treatment Phase: Active Tx;Treatment (11/25/15 1000)                            Time Spent with Patient: 30 (11/25/15 1000)   Met patient in [facility] Day Surgery with interpreter [facility]  Spoke to son, who speaks [place], in waiting area.  He and his brother drove from [place] last night to be with their mother.  Oriented him to hospital, explained pager system for visiting surgical patients, and Patient also has 2 sons who live in [place].  Patient has a great deal of family support.

## 2015-11-25 NOTE — Transfer of Care (Signed)
Immediate Anesthesia Transfer of Care Note  Patient: Jasmine Gardner  Procedure(s) Performed: Procedure(s): PARTIAL MASTECTOMY WITH NEEDLE LOCALIZATION (Left) SENTINEL NODE BIOPSY (Left)  Patient Location: PACU  Anesthesia Type:General  Level of Consciousness: awake, alert , oriented and patient cooperative  Airway & Oxygen Therapy: Patient Spontanous Breathing and Patient connected to face mask oxygen  Post-op Assessment: Report given to RN, Post -op Vital signs reviewed and stable and Patient moving all extremities X 4  Post vital signs: Reviewed and stable  Last Vitals:  Vitals:   11/25/15 0941  BP: (!) 149/78  Pulse: 69  Resp: 16  Temp: 36.2 C    Last Pain:  Vitals:   11/25/15 0941  TempSrc: Tympanic         Complications: No apparent anesthesia complications

## 2015-11-25 NOTE — H&P (Signed)
  She was interviewed with an interpreter. She reports no change in overall condition since the recent office visit.  She has had x-ray needle localization and injection of technetium sulfur colloid.  I discussed the plan for left partial mastectomy with sentinel lymph node biopsy  The left breast has been marked YES

## 2015-11-25 NOTE — Discharge Instructions (Signed)
Take Tylenol or Norco if needed for pain.  Should not drive or do anything dangerous when taking Norco.  May shower and blot dry.  Were bra as desired for comfort and support.

## 2015-11-28 ENCOUNTER — Encounter: Payer: Self-pay | Admitting: Surgery

## 2015-11-29 LAB — SURGICAL PATHOLOGY

## 2015-11-29 NOTE — Progress Notes (Signed)
  Oncology Nurse Navigator Documentation  Navigator Location: [facility] (11/29/15 1200) Navigator Encounter Type: Telephone (11/29/15 1200)           Patient Visit Type: Post-op Appt (11/29/15 1200) Treatment Phase: Active Tx (11/29/15 1200)                            Time Spent with Patient: 30 (11/29/15 1200)  Patient doing well post-op.  Minimal discomfort.  Sons doing great job as caregivers.  Chip Boer Murr interpreted exam.

## 2015-12-11 NOTE — Progress Notes (Deleted)
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  INTERVAL HISTORY: Patient is a 65 year old female who was noted to have an abnormality on routine screening mammogram. Subsequent ultrasound and biopsy revealed the above stated risk cancer. Patient is Spanish-speaking only and the entire visit was done and presence of an interpreter. She currently feels well and is asymptomatic. She has some residual breast tenderness from her biopsy. She has no neurologic complaints. She denies any recent fevers or illnesses. She has a good appetite and denies weight loss. She denies any chest pain or shortness of breath. She denies any nausea, vomiting, constipation, or diarrhea. She has no urinary complaints. Patient otherwise feels well and offers no further specific complaints.  REVIEW OF SYSTEMS:   Review of Systems  Constitutional: Negative.  Negative for fever, malaise/fatigue and weight loss.  Respiratory: Negative.  Negative for cough and shortness of breath.   Cardiovascular: Negative.  Negative for chest pain.  Gastrointestinal: Negative.  Negative for abdominal pain.  Genitourinary: Negative.   Musculoskeletal: Negative.   Neurological: Negative.  Negative for weakness.  Psychiatric/Behavioral: Negative.     As per HPI. Otherwise, a complete review of systems is negatve.  PAST MEDICAL HISTORY: Past Medical History:  Diagnosis Date  . Arthritis   . Depression   . GERD (gastroesophageal reflux disease)   . Headache   . Hypertension   . Post-menopausal   . UTI (urinary tract infection)     PAST SURGICAL HISTORY: Past Surgical History:  Procedure Laterality Date  . COLONOSCOPY   2007  . PARTIAL MASTECTOMY WITH NEEDLE LOCALIZATION Left 11/25/2015   Procedure: PARTIAL MASTECTOMY WITH NEEDLE LOCALIZATION;  Surgeon: Leonie Green, MD;  Location: [facility] ORS;  Service: General;  Laterality: Left;  . SENTINEL NODE BIOPSY Left 11/25/2015   Procedure: SENTINEL NODE BIOPSY;  Surgeon: Leonie Green, MD;  Location: [facility] ORS;  Service: General;  Laterality: Left;  . TUBAL LIGATION      FAMILY HISTORY: Reviewed and unchanged. No reported history of malignancy or chronic disease.     ADVANCED DIRECTIVES (Y/N):  N   HEALTH MAINTENANCE: Social History  Substance Use Topics  . Smoking status: Never Smoker  . Smokeless tobacco: Never Used  . Alcohol use No     Colonoscopy:  PAP:  Bone density:  Lipid panel:  No Known Allergies  Current Outpatient Prescriptions  Medication Sig Dispense Refill  . amLODipine (NORVASC) 5 MG tablet Take 5 mg by mouth daily.    . bisoprolol-hydrochlorothiazide (ZIAC) 10-6.25 MG tablet Take 1 tablet by mouth 2 (two) times daily.    [name] HYDROcodone-acetaminophen (NORCO) 5-325 MG tablet Take 1-2 tablets by mouth every 4 (four) hours as needed for moderate pain. 12 tablet 0  . ibuprofen (ADVIL,MOTRIN) 200 MG tablet Take 1 tablet by mouth every 8 (eight) hours as needed.    [name] lisinopril (PRINIVIL,ZESTRIL) 40 MG tablet Take 40 mg by mouth at bedtime.    [name] loratadine (CLARITIN) 10 MG tablet Take 10 mg by mouth daily.    . pravastatin (PRAVACHOL) 20 MG tablet Take 20 mg by mouth at bedtime.    . senna (SENOKOT) 8.6 MG TABS tablet Take 1-2 tablets by mouth at bedtime.    [name]  sertraline (ZOLOFT) 25 MG tablet Take 25 mg by mouth daily.     No current facility-administered medications for this visit.     OBJECTIVE: There were no vitals filed for this visit.   There is no height or weight on file to calculate BMI.    ECOG FS:0 - Asymptomatic  General: Well-developed, well-nourished, no acute distress. Eyes: Pink conjunctiva, anicteric  sclera. HEENT: Normocephalic, moist mucous membranes, clear oropharnyx. Breasts: Patient requested exam be deferred today. Lungs: Clear to auscultation bilaterally. Heart: Regular rate and rhythm. No rubs, murmurs, or gallops. Abdomen: Soft, nontender, nondistended. No organomegaly noted, normoactive bowel sounds. Musculoskeletal: No edema, cyanosis, or clubbing. Neuro: Alert, answering all questions appropriately. Cranial nerves grossly intact. Skin: No rashes or petechiae noted. Psych: Normal affect. Lymphatics: No cervical, calvicular, axillary or inguinal LAD.   LAB RESULTS:  No results found for: NA, K, CL, CO2, GLUCOSE, BUN, CREATININE, CALCIUM, PROT, ALBUMIN, AST, ALT, ALKPHOS, BILITOT, GFRNONAA, GFRAA  No results found for: WBC, NEUTROABS, HGB, HCT, MCV, PLT   STUDIES: Nm Sentinel Node Injection  Result Date: 11/25/2015 CLINICAL DATA:  Left breast cancer. EXAM: NUCLEAR MEDICINE BREAST LYMPHOSCINTIGRAPHY LEFT BREAST TECHNIQUE: Intradermal injection of radiopharmaceutical was performed at the 12 o'clock, 3 o'clock, 6 o'clock, and 9 o'clock positions around the left nipple. The patient was then sent to the operating room where the sentinel node(s) were identified and removed by the surgeon. RADIOPHARMACEUTICALS:  Total of 1 mCi Millipore-filtered Technetium-53msulfur colloid, injected into the left periareolar region. IMPRESSION: Successful injection for sentinel node study left breast . Electronically Signed   By: TMarcello Moores Register   On: 11/25/2015 09:51   Mm Breast Surgical Specimen  Result Date: 11/25/2015 CLINICAL DATA:  Status post lumpectomy today after preoperative needle localization. EXAM: SPECIMEN RADIOGRAPH OF THE LEFT BREAST COMPARISON:  Previous exam(s). FINDINGS: Status post excision of the left breast. The wire tip and biopsy marker clip are present and are marked for pathology. IMPRESSION: Specimen radiograph of the left breast. Electronically Signed   By: SFranki Cabot M.D.   On: 11/25/2015 11:44   Mm Lt Plc Breast Loc Dev   1st Lesion  Inc Mammo Guide  Result Date: 11/25/2015 CLINICAL DATA:  Recent biopsy-proven left breast cancer. Patient presents today for preoperative needle localization. EXAM: NEEDLE LOCALIZATION OF THE LEFT BREAST WITH MAMMO GUIDANCE COMPARISON:  Previous exams. FINDINGS: Patient presents for needle localization prior to breast conservation surgery. I met with the patient and we discussed the procedure of needle localization including benefits and alternatives. We discussed the high likelihood of a successful procedure. We discussed the risks of the procedure, including infection, bleeding, tissue injury, and further surgery. Informed, written consent was given. The usual time-out protocol was performed immediately prior to the procedure. Using mammographic guidance, sterile technique, 1% lidocaine and a 7 cm modified Kopans needle, the mass containing a coil shaped clip within the outer left breast localized using lateral approach. The images were marked for Dr. STamala Julian IMPRESSION: Needle localization left breast. No apparent complications. Electronically Signed   By: SFranki CabotM.D.   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  PLAN:    1. 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 Patient has an appointment with surgery later  today. It is unclear whether patient will require adjuvant chemotherapy, but Oncotype DX has been ordered from her biopsy specimen. Patient will return to clinic one to 2 weeks after her surgery to discuss the final pathology results and additional treatment planning. She will also likely have an appointment with radiation oncology at that time.  Finally, given ER/PR status of the patient's tumor she will require either tamoxifen or an aromatase inhibitor for 5 years. 2. Osteoporosis: Patient's bone mineral density on October 25, 2015 revealed a T score of -2.6 which is considered osteoporotic. Although the recommendation for postmenopausal women is an aromatase inhibitor, this puts her at increased risk for osteoporosis therefore she may be placed on tamoxifen for 5 years.  Approximately 45 minutes was spent in discussion of which greater than 50% was consultation.  The entire visit was not done in the presence of an interpreter.  Patient expressed understanding and was in agreement with this plan. She also understands that She can call clinic at any time with any questions, concerns, or complaints.   Primary cancer of upper outer quadrant of left female breast [facility])   Staging form: Breast, AJCC 7th Edition   - Clinical stage from 11/16/2015: Stage IA (T1b, N0, M0) - Signed by Lloyd Huger, MD on 11/17/2015  Lloyd Huger, MD   12/11/2015 10:27 PM

## 2015-12-12 ENCOUNTER — Ambulatory Visit: Payer: Medicare Other | Admitting: Radiation Oncology

## 2015-12-12 ENCOUNTER — Inpatient Hospital Stay: Payer: Medicare Other | Admitting: Oncology

## 2015-12-13 NOTE — Progress Notes (Deleted)
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  INTERVAL HISTORY: Patient is a 65 year old female who was noted to have an abnormality on routine screening mammogram. Subsequent ultrasound and biopsy revealed the above stated risk cancer. Patient is Spanish-speaking only and the entire visit was done and presence of an interpreter. She currently feels well and is asymptomatic. She has some residual breast tenderness from her biopsy. She has no neurologic complaints. She denies any recent fevers or illnesses. She has a good appetite and denies weight loss. She denies any chest pain or shortness of breath. She denies any nausea, vomiting, constipation, or diarrhea. She has no urinary complaints. Patient otherwise feels well and offers no further specific complaints.  REVIEW OF SYSTEMS:   Review of Systems  Constitutional: Negative.  Negative for fever, malaise/fatigue and weight loss.  Respiratory: Negative.  Negative for cough and shortness of breath.   Cardiovascular: Negative.  Negative for chest pain.  Gastrointestinal: Negative.  Negative for abdominal pain.  Genitourinary: Negative.   Musculoskeletal: Negative.   Neurological: Negative.  Negative for weakness.  Psychiatric/Behavioral: Negative.     As per HPI. Otherwise, a complete review of systems is negatve.  PAST MEDICAL HISTORY: Past Medical History:  Diagnosis Date  . Arthritis   . Depression   . GERD (gastroesophageal reflux disease)   . Headache   . Hypertension   . Post-menopausal   . UTI (urinary tract infection)     PAST SURGICAL HISTORY: Past Surgical History:  Procedure Laterality Date  . COLONOSCOPY   2007  . PARTIAL MASTECTOMY WITH NEEDLE LOCALIZATION Left 11/25/2015   Procedure: PARTIAL MASTECTOMY WITH NEEDLE LOCALIZATION;  Surgeon: Leonie Green, MD;  Location: [facility] ORS;  Service: General;  Laterality: Left;  . SENTINEL NODE BIOPSY Left 11/25/2015   Procedure: SENTINEL NODE BIOPSY;  Surgeon: Leonie Green, MD;  Location: [facility] ORS;  Service: General;  Laterality: Left;  . TUBAL LIGATION      FAMILY HISTORY: Reviewed and unchanged. No reported history of malignancy or chronic disease.     ADVANCED DIRECTIVES (Y/N):  N   HEALTH MAINTENANCE: Social History  Substance Use Topics  . Smoking status: Never Smoker  . Smokeless tobacco: Never Used  . Alcohol use No     Colonoscopy:  PAP:  Bone density:  Lipid panel:  No Known Allergies  Current Outpatient Prescriptions  Medication Sig Dispense Refill  . amLODipine (NORVASC) 5 MG tablet Take 5 mg by mouth daily.    . bisoprolol-hydrochlorothiazide (ZIAC) 10-6.25 MG tablet Take 1 tablet by mouth 2 (two) times daily.    [name] HYDROcodone-acetaminophen (NORCO) 5-325 MG tablet Take 1-2 tablets by mouth every 4 (four) hours as needed for moderate pain. 12 tablet 0  . ibuprofen (ADVIL,MOTRIN) 200 MG tablet Take 1 tablet by mouth every 8 (eight) hours as needed.    [name] lisinopril (PRINIVIL,ZESTRIL) 40 MG tablet Take 40 mg by mouth at bedtime.    [name] loratadine (CLARITIN) 10 MG tablet Take 10 mg by mouth daily.    . pravastatin (PRAVACHOL) 20 MG tablet Take 20 mg by mouth at bedtime.    . senna (SENOKOT) 8.6 MG TABS tablet Take 1-2 tablets by mouth at bedtime.    [name]  sertraline (ZOLOFT) 25 MG tablet Take 25 mg by mouth daily.     No current facility-administered medications for this visit.     OBJECTIVE: There were no vitals filed for this visit.   There is no height or weight on file to calculate BMI.    ECOG FS:0 - Asymptomatic  General: Well-developed, well-nourished, no acute distress. Eyes: Pink conjunctiva, anicteric  sclera. HEENT: Normocephalic, moist mucous membranes, clear oropharnyx. Breasts: Patient requested exam be deferred today. Lungs: Clear to auscultation bilaterally. Heart: Regular rate and rhythm. No rubs, murmurs, or gallops. Abdomen: Soft, nontender, nondistended. No organomegaly noted, normoactive bowel sounds. Musculoskeletal: No edema, cyanosis, or clubbing. Neuro: Alert, answering all questions appropriately. Cranial nerves grossly intact. Skin: No rashes or petechiae noted. Psych: Normal affect. Lymphatics: No cervical, calvicular, axillary or inguinal LAD.   LAB RESULTS:  No results found for: NA, K, CL, CO2, GLUCOSE, BUN, CREATININE, CALCIUM, PROT, ALBUMIN, AST, ALT, ALKPHOS, BILITOT, GFRNONAA, GFRAA  No results found for: WBC, NEUTROABS, HGB, HCT, MCV, PLT   STUDIES: Nm Sentinel Node Injection  Result Date: 11/25/2015 CLINICAL DATA:  Left breast cancer. EXAM: NUCLEAR MEDICINE BREAST LYMPHOSCINTIGRAPHY LEFT BREAST TECHNIQUE: Intradermal injection of radiopharmaceutical was performed at the 12 o'clock, 3 o'clock, 6 o'clock, and 9 o'clock positions around the left nipple. The patient was then sent to the operating room where the sentinel node(s) were identified and removed by the surgeon. RADIOPHARMACEUTICALS:  Total of 1 mCi Millipore-filtered Technetium-53msulfur colloid, injected into the left periareolar region. IMPRESSION: Successful injection for sentinel node study left breast . Electronically Signed   By: TMarcello Moores Register   On: 11/25/2015 09:51   Mm Breast Surgical Specimen  Result Date: 11/25/2015 CLINICAL DATA:  Status post lumpectomy today after preoperative needle localization. EXAM: SPECIMEN RADIOGRAPH OF THE LEFT BREAST COMPARISON:  Previous exam(s). FINDINGS: Status post excision of the left breast. The wire tip and biopsy marker clip are present and are marked for pathology. IMPRESSION: Specimen radiograph of the left breast. Electronically Signed   By: SFranki Cabot M.D.   On: 11/25/2015 11:44   Mm Lt Plc Breast Loc Dev   1st Lesion  Inc Mammo Guide  Result Date: 11/25/2015 CLINICAL DATA:  Recent biopsy-proven left breast cancer. Patient presents today for preoperative needle localization. EXAM: NEEDLE LOCALIZATION OF THE LEFT BREAST WITH MAMMO GUIDANCE COMPARISON:  Previous exams. FINDINGS: Patient presents for needle localization prior to breast conservation surgery. I met with the patient and we discussed the procedure of needle localization including benefits and alternatives. We discussed the high likelihood of a successful procedure. We discussed the risks of the procedure, including infection, bleeding, tissue injury, and further surgery. Informed, written consent was given. The usual time-out protocol was performed immediately prior to the procedure. Using mammographic guidance, sterile technique, 1% lidocaine and a 7 cm modified Kopans needle, the mass containing a coil shaped clip within the outer left breast localized using lateral approach. The images were marked for Dr. STamala Julian IMPRESSION: Needle localization left breast. No apparent complications. Electronically Signed   By: SFranki CabotM.D.   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  PLAN:    1. 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 Patient has an appointment with surgery later  today. It is unclear whether patient will require adjuvant chemotherapy, but Oncotype DX has been ordered from her biopsy specimen. Patient will return to clinic one to 2 weeks after her surgery to discuss the final pathology results and additional treatment planning. She will also likely have an appointment with radiation oncology at that time.  Finally, given ER/PR status of the patient's tumor she will require either tamoxifen or an aromatase inhibitor for 5 years. 2. Osteoporosis: Patient's bone mineral density on October 25, 2015 revealed a T score of -2.6 which is considered osteoporotic. Although the recommendation for postmenopausal women is an aromatase inhibitor, this puts her at increased risk for osteoporosis therefore she may be placed on tamoxifen for 5 years.  Approximately 45 minutes was spent in discussion of which greater than 50% was consultation.  The entire visit was not done in the presence of an interpreter.  Patient expressed understanding and was in agreement with this plan. She also understands that She can call clinic at any time with any questions, concerns, or complaints.   Primary cancer of upper outer quadrant of left female breast [facility])   Staging form: Breast, AJCC 7th Edition   - Clinical stage from 11/16/2015: Stage IA (T1b, N0, M0) - Signed by Lloyd Huger, MD on 11/17/2015  Lloyd Huger, MD   12/13/2015 1:12 PM

## 2015-12-15 ENCOUNTER — Inpatient Hospital Stay: Payer: Medicare Other | Admitting: Oncology

## 2015-12-15 ENCOUNTER — Encounter: Payer: Self-pay | Admitting: Radiation Oncology

## 2015-12-15 ENCOUNTER — Ambulatory Visit
Admission: RE | Admit: 2015-12-15 | Discharge: 2015-12-15 | Disposition: A | Discharge status: Home / Self Care | Payer: Medicare Other | Source: Ambulatory Visit | Attending: Radiation Oncology | Admitting: Radiation Oncology

## 2015-12-15 VITALS — BP 132/80 | HR 66 | Temp 97.8°F | Wt 159.8 lb

## 2015-12-15 DIAGNOSIS — K219 Gastro-esophageal reflux disease without esophagitis: Secondary | ICD-10-CM | POA: Insufficient documentation

## 2015-12-15 DIAGNOSIS — M129 Arthropathy, unspecified: Secondary | ICD-10-CM | POA: Diagnosis not present

## 2015-12-15 DIAGNOSIS — Z87440 Personal history of urinary (tract) infections: Secondary | ICD-10-CM | POA: Insufficient documentation

## 2015-12-15 DIAGNOSIS — Z170 Estrogen receptor positive status [ER+]: Secondary | ICD-10-CM | POA: Insufficient documentation

## 2015-12-15 DIAGNOSIS — Z79899 Other long term (current) drug therapy: Secondary | ICD-10-CM | POA: Diagnosis not present

## 2015-12-15 DIAGNOSIS — Z9012 Acquired absence of left breast and nipple: Secondary | ICD-10-CM | POA: Diagnosis not present

## 2015-12-15 DIAGNOSIS — R51 Headache: Secondary | ICD-10-CM | POA: Insufficient documentation

## 2015-12-15 DIAGNOSIS — I10 Essential (primary) hypertension: Secondary | ICD-10-CM | POA: Insufficient documentation

## 2015-12-15 DIAGNOSIS — C50412 Malignant neoplasm of upper-outer quadrant of left female breast: Secondary | ICD-10-CM | POA: Diagnosis not present

## 2015-12-15 DIAGNOSIS — Z510 Encounter for antineoplastic radiation therapy: Secondary | ICD-10-CM | POA: Diagnosis not present

## 2015-12-15 DIAGNOSIS — F329 Major depressive disorder, single episode, unspecified: Secondary | ICD-10-CM | POA: Insufficient documentation

## 2015-12-15 NOTE — Consult Note (Signed)
NEW PATIENT EVALUATION  Name: Jasmine Gardner  MRN: [id number]  Date:   12/15/2015     DOB: 12/12/1950   This 65 y.o. female patient presents to the clinic for initial evaluation of stage I (T1 be N0 M0) invasive mammary carcinoma of the left breast status post wide local excision and sentinel node biopsy.  REFERRING PHYSICIAN: [facility] [place]:  Chief Complaint  Patient presents with  . Breast Cancer    DIAGNOSIS: The encounter diagnosis was Malignant neoplasm of upper-outer quadrant of left female breast ([place]).   PREVIOUS INVESTIGATIONS:  Pathology reports reviewed Mammograms and ultrasound reviewed Clinical notes reviewed  HPI: Patient is a 65 year old Spanish-speaking female seen today in company by interpreter who presented with an abnormal mammogram of her left breast. Initially mammogram and ultrasound confirmed an 8 mm suspicious mass locating at the 2:30 position of the left breast 8 cm from the nipple. Ultrasound the left axilla was within normal limits. Patient had a needle localization positive for invasive mammary carcinoma. She went on to have a wide local excision and sentinel node biopsy. Tumor was 1 cm in greatest dimension overall nuclear grade 2. Margins were clear at 6 mm. Tumor was ER/PR positive HER-2/neu not overexpressed by fish study. She is tolerated her treatments well. MammaPrint has been ordered and is pending. She specifically denies breast tenderness cough or bone pain.  PLANNED TREATMENT REGIMEN: Whole breast radiation and antiestrogen therapy  PAST MEDICAL HISTORY:  has a past medical history of Arthritis; Depression; GERD (gastroesophageal reflux disease); Headache; Hypertension; Post-menopausal; and UTI (urinary tract infection).    PAST SURGICAL HISTORY:  Past Surgical History:  Procedure Laterality Date  . COLONOSCOPY  2007  . PARTIAL MASTECTOMY WITH NEEDLE LOCALIZATION Left 11/25/2015   Procedure: PARTIAL MASTECTOMY  WITH NEEDLE LOCALIZATION;  Surgeon: Leonie Green, MD;  Location: [facility] ORS;  Service: General;  Laterality: Left;  . SENTINEL NODE BIOPSY Left 11/25/2015   Procedure: SENTINEL NODE BIOPSY;  Surgeon: Leonie Green, MD;  Location: [facility] ORS;  Service: General;  Laterality: Left;  . TUBAL LIGATION      FAMILY HISTORY: family history is not on file.  SOCIAL HISTORY:  reports that she has never smoked. She has never used smokeless tobacco. She reports that she does not drink alcohol or use drugs.  ALLERGIES: Review of patient's allergies indicates no known allergies.  MEDICATIONS:  Current Outpatient Prescriptions  Medication Sig Dispense Refill  . amLODipine (NORVASC) 5 MG tablet Take 5 mg by mouth daily.    . bisoprolol-hydrochlorothiazide (ZIAC) 10-6.25 MG tablet Take 1 tablet by mouth 2 (two) times daily.    . calcium carbonate (OS-CAL - DOSED IN MG OF ELEMENTAL CALCIUM) 1250 (500 Ca) MG tablet Take by mouth.    . Cholecalciferol (VITAMIN D-1000 MAX ST) 1000 units tablet Take by mouth.    [name] HYDROcodone-acetaminophen (NORCO) 5-325 MG tablet Take 1-2 tablets by mouth every 4 (four) hours as needed for moderate pain. 12 tablet 0  . ibuprofen (ADVIL,MOTRIN) 200 MG tablet Take 1 tablet by mouth every 8 (eight) hours as needed.    [name] lisinopril (PRINIVIL,ZESTRIL) 40 MG tablet Take 40 mg by mouth at bedtime.    [name] loratadine (CLARITIN) 10 MG tablet Take 10 mg by mouth daily.    . pravastatin (PRAVACHOL) 20 MG tablet Take 20 mg by mouth at bedtime.    . senna (SENOKOT) 8.6 MG TABS tablet Take 1-2 tablets by mouth at bedtime.    [name]  sertraline (ZOLOFT) 25 MG tablet Take 25 mg by mouth daily.     No current facility-administered medications for this encounter.     ECOG PERFORMANCE STATUS:  0 - Asymptomatic  REVIEW OF SYSTEMS:  Patient denies any weight loss, fatigue, weakness, fever, chills or night sweats. Patient denies any loss of vision, blurred vision. Patient denies any ringing  of  the ears or hearing loss. No irregular heartbeat. Patient denies heart murmur or history of fainting. Patient denies any chest pain or pain radiating to her upper extremities. Patient denies any shortness of breath, difficulty breathing at night, cough or hemoptysis. Patient denies any swelling in the lower legs. Patient denies any nausea vomiting, vomiting of blood, or coffee ground material in the vomitus. Patient denies any stomach pain. Patient states has had normal bowel movements no significant constipation or diarrhea. Patient denies any dysuria, hematuria or significant nocturia. Patient denies any problems walking, swelling in the joints or loss of balance. Patient denies any skin changes, loss of hair or loss of weight. Patient denies any excessive worrying or anxiety or significant depression. Patient denies any problems with insomnia. Patient denies excessive thirst, polyuria, polydipsia. Patient denies any swollen glands, patient denies easy bruising or easy bleeding. Patient denies any recent infections, allergies or URI. Patient "s visual fields have not changed significantly in recent time.    PHYSICAL EXAM: BP 132/80   Pulse 66   Temp 97.8 F (36.6 C)   Wt 159 lb 13.3 oz (72.5 kg)   BMI 32.28 kg/m  Patient is has large pendulous breasts. She status post wide local excision of the left breast with Enseal incision healing well. No dominant mass or nodularity is noted in either breast in 2 positions examined. Well-developed well-nourished patient in NAD. HEENT reveals PERLA, EOMI, discs not visualized.  Oral cavity is clear. No oral mucosal lesions are identified. Neck is clear without evidence of cervical or supraclavicular adenopathy. Lungs are clear to A&P. Cardiac examination is essentially unremarkable with regular rate and rhythm without murmur rub or thrill. Abdomen is benign with no organomegaly or masses noted. Motor sensory and DTR levels are equal and symmetric in the upper and  lower extremities. Cranial nerves II through XII are grossly intact. Proprioception is intact. No peripheral adenopathy or edema is identified. No motor or sensory levels are noted. Crude visual fields are within normal range.  LABORATORY DATA: Pathology reports reviewed    RADIOLOGY RESULTS: Mammograms and ultrasound reviewed   IMPRESSION: Stage I invasive mammary carcinoma the left breast status post wide local excision and sentinel node biopsy MammaPrint pending tumor is ER/PR positive HER-2/neu not overexpressed in 65 year old female  PLAN: At this time I would recommend whole breast radiation up to 5040 cGy in 28 fractions. Also boost her scar another 1400 cGy using electron beam. We will wait the MammaPrint results and sequence radiation after chemotherapy should that be indicated. Otherwise have personally set up and ordered CT simulation for three-dimensional treatment planning next week. Risks and benefits of treatment including skin reaction fatigue alteration of blood counts possible inclusion of superficial lung all were discussed in detail with the patient. She seems to comprehend my treatment plan well. All conversation was through our [place] interpreter. Patient also will be candidate for antiestrogen therapy after completion of radiation.  I would like to take this opportunity to thank you for allowing me to participate in the care of your patient.Armstead Peaks., MD

## 2015-12-19 ENCOUNTER — Encounter: Payer: Self-pay | Admitting: Oncology

## 2015-12-20 NOTE — Progress Notes (Signed)
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 Low risk Oncotype Dx of 15.  INTERVAL HISTORY: Patient returns to clinic today for further evaluation and discussion of her Oncotype score. She currently feels well and is asymptomatic. She has no neurologic complaints. She denies any recent fevers or illnesses. She has a good appetite and denies weight loss. She denies any chest pain or shortness of breath. She denies any nausea, vomiting, constipation, or diarrhea. She has no urinary complaints. Patient offers no specific complaints today.  REVIEW OF SYSTEMS:   Review of Systems  Constitutional: Negative.  Negative for fever, malaise/fatigue and weight loss.  Respiratory: Negative.  Negative for cough and shortness of breath.   Cardiovascular: Negative.  Negative for chest pain.  Gastrointestinal: Negative.  Negative for abdominal pain.  Genitourinary: Negative.   Musculoskeletal: Negative.   Neurological: Negative.  Negative for weakness.  Psychiatric/Behavioral: Negative.     As per HPI. Otherwise, a complete review of systems is negative.  PAST MEDICAL HISTORY: Past Medical History:  Diagnosis Date  . Arthritis   . Depression   . GERD (gastroesophageal reflux disease)   . Headache   . Hypertension   . Post-menopausal   . UTI (urinary tract infection)     PAST SURGICAL HISTORY: Past Surgical History:  Procedure Laterality Date  . COLONOSCOPY  2007  . PARTIAL MASTECTOMY WITH NEEDLE LOCALIZATION Left 11/25/2015   Procedure: PARTIAL MASTECTOMY WITH NEEDLE LOCALIZATION;  Surgeon: Jarvis Wilton Smith, MD;  Location: [facility] ORS;  Service: General;  Laterality: Left;  .  SENTINEL NODE BIOPSY Left 11/25/2015   Procedure: SENTINEL NODE BIOPSY;  Surgeon: Jarvis Wilton Smith, MD;  Location: [facility] ORS;  Service: General;  Laterality: Left;  . TUBAL LIGATION      FAMILY HISTORY: Reviewed and unchanged. No reported history of malignancy or chronic disease.     ADVANCED DIRECTIVES (Y/N):  N   HEALTH MAINTENANCE: Social History  Substance Use Topics  . Smoking status: Never Smoker  . Smokeless tobacco: Never Used  . Alcohol use No     Colonoscopy:  PAP:  Bone density:  Lipid panel:  No Known Allergies  Current Outpatient Prescriptions  Medication Sig Dispense Refill  . amLODipine (NORVASC) 5 MG tablet Take 5 mg by mouth daily.    . bisoprolol-hydrochlorothiazide (ZIAC) 10-6.25 MG tablet Take 1 tablet by mouth 2 (two) times daily.    . calcium carbonate (OS-CAL - DOSED IN MG OF ELEMENTAL CALCIUM) 1250 (500 Ca) MG tablet Take by mouth.    . Cholecalciferol (VITAMIN D-1000 MAX ST) 1000 units tablet Take by mouth.    . HYDROcodone-acetaminophen (NORCO) 5-325 MG tablet Take 1-2 tablets by mouth every 4 (four) hours as needed for moderate pain. 12 tablet 0  . ibuprofen (ADVIL,MOTRIN) 200 MG tablet Take 1 tablet by mouth every 8 (eight) hours as needed.    . lisinopril (PRINIVIL,ZESTRIL) 40 MG tablet Take 40 mg by mouth at bedtime.    . loratadine (CLARITIN) 10 MG tablet Take 10 mg by mouth daily.    . pravastatin (PRAVACHOL) 20 MG tablet Take 20 mg by mouth at bedtime.    . senna (SENOKOT) 8.6 MG TABS tablet Take 1-2 tablets by mouth at bedtime.    .   sertraline (ZOLOFT) 25 MG tablet Take 25 mg by mouth daily.     No current facility-administered medications for this visit.     OBJECTIVE: Vitals:   12/21/15 1058  BP: 140/84  Pulse: 68  Resp: 18  Temp: 98.6 F (37 C)     Body mass index is 32.35 kg/m.    ECOG FS:0 - Asymptomatic  General: Well-developed, well-nourished, no acute distress. Eyes: Pink conjunctiva, anicteric sclera. Breasts: Patient  requested exam be deferred today. Lungs: Clear to auscultation bilaterally. Heart: Regular rate and rhythm. No rubs, murmurs, or gallops. Abdomen: Soft, nontender, nondistended. No organomegaly noted, normoactive bowel sounds. Musculoskeletal: No edema, cyanosis, or clubbing. Neuro: Alert, answering all questions appropriately. Cranial nerves grossly intact. Skin: No rashes or petechiae noted. Psych: Normal affect.   LAB RESULTS:  No results found for: NA, K, CL, CO2, GLUCOSE, BUN, CREATININE, CALCIUM, PROT, ALBUMIN, AST, ALT, ALKPHOS, BILITOT, GFRNONAA, GFRAA  No results found for: WBC, NEUTROABS, HGB, HCT, MCV, PLT   STUDIES: Nm Sentinel Node Injection  Result Date: 11/25/2015 CLINICAL DATA:  Left breast cancer. EXAM: NUCLEAR MEDICINE BREAST LYMPHOSCINTIGRAPHY LEFT BREAST TECHNIQUE: Intradermal injection of radiopharmaceutical was performed at the 12 o'clock, 3 o'clock, 6 o'clock, and 9 o'clock positions around the left nipple. The patient was then sent to the operating room where the sentinel node(s) were identified and removed by the surgeon. RADIOPHARMACEUTICALS:  Total of 1 mCi Millipore-filtered Technetium-99m sulfur colloid, injected into the left periareolar region. IMPRESSION: Successful injection for sentinel node study left breast . Electronically Signed   By: Thomas  Register   On: 11/25/2015 09:51   Mm Breast Surgical Specimen  Result Date: 11/25/2015 CLINICAL DATA:  Status post lumpectomy today after preoperative needle localization. EXAM: SPECIMEN RADIOGRAPH OF THE LEFT BREAST COMPARISON:  Previous exam(s). FINDINGS: Status post excision of the left breast. The wire tip and biopsy marker clip are present and are marked for pathology. IMPRESSION: Specimen radiograph of the left breast. Electronically Signed   By: Stan  Maynard M.D.   On: 11/25/2015 11:44   Mm Lt Plc Breast Loc Dev   1st Lesion  Inc Mammo Guide  Result Date: 11/25/2015 CLINICAL DATA:  Recent biopsy-proven left  breast cancer. Patient presents today for preoperative needle localization. EXAM: NEEDLE LOCALIZATION OF THE LEFT BREAST WITH MAMMO GUIDANCE COMPARISON:  Previous exams. FINDINGS: Patient presents for needle localization prior to breast conservation surgery. I met with the patient and we discussed the procedure of needle localization including benefits and alternatives. We discussed the high likelihood of a successful procedure. We discussed the risks of the procedure, including infection, bleeding, tissue injury, and further surgery. Informed, written consent was given. The usual time-out protocol was performed immediately prior to the procedure. Using mammographic guidance, sterile technique, 1% lidocaine and a 7 cm modified Kopans needle, the mass containing a coil shaped clip within the outer left breast localized using lateral approach. The images were marked for Dr. Smith. IMPRESSION: Needle localization left breast. No apparent complications. Electronically Signed   By: Stan  Maynard M.D.   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 Low risk Oncotype DX and 15.  PLAN:    1. 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 Her Oncotype score is 15 which is considered low risk,   therefore she does not require adjuvant chemotherapy. Patient will initiate adjuvant XRT on January 02, 2016. Return to clinic in 2 months at the conclusion of her XRT for further evaluation and initiation of an aromatase inhibitor or tamoxifen.  2. Osteoporosis: Patient's bone mineral density on October 25, 2015 revealed a T score of -2.6 which is considered osteoporotic. Although the recommendation for postmenopausal women is an aromatase inhibitor, this puts her at increased risk for  osteoporosis therefore she may be placed on tamoxifen for 5 years. Will repeat bone marrow density prior to initiating treatment.  Approximately 30 minutes was spent in discussion of which greater than 50% was consultation.  The entire visit was not done in the presence of an interpreter.  Patient expressed understanding and was in agreement with this plan. She also understands that She can call clinic at any time with any questions, concerns, or complaints.   Primary cancer of upper outer quadrant of left female breast [facility])   Staging form: Breast, AJCC 7th Edition   - Clinical stage from 11/16/2015: Stage IA (T1b, N0, M0) - Signed by Lloyd Huger, MD on 11/17/2015  Lloyd Huger, MD   12/23/2015 6:54 PM

## 2015-12-21 ENCOUNTER — Inpatient Hospital Stay: Payer: Medicare Other | Attending: Oncology | Admitting: Oncology

## 2015-12-21 VITALS — BP 140/84 | HR 68 | Temp 98.6°F | Resp 18 | Wt 160.2 lb

## 2015-12-21 DIAGNOSIS — Z8669 Personal history of other diseases of the nervous system and sense organs: Secondary | ICD-10-CM

## 2015-12-21 DIAGNOSIS — F329 Major depressive disorder, single episode, unspecified: Secondary | ICD-10-CM | POA: Insufficient documentation

## 2015-12-21 DIAGNOSIS — M818 Other osteoporosis without current pathological fracture: Secondary | ICD-10-CM | POA: Diagnosis not present

## 2015-12-21 DIAGNOSIS — Z87440 Personal history of urinary (tract) infections: Secondary | ICD-10-CM | POA: Insufficient documentation

## 2015-12-21 DIAGNOSIS — M129 Arthropathy, unspecified: Secondary | ICD-10-CM | POA: Diagnosis not present

## 2015-12-21 DIAGNOSIS — Z170 Estrogen receptor positive status [ER+]: Secondary | ICD-10-CM | POA: Diagnosis not present

## 2015-12-21 DIAGNOSIS — K219 Gastro-esophageal reflux disease without esophagitis: Secondary | ICD-10-CM | POA: Insufficient documentation

## 2015-12-21 DIAGNOSIS — Z9012 Acquired absence of left breast and nipple: Secondary | ICD-10-CM

## 2015-12-21 DIAGNOSIS — C50412 Malignant neoplasm of upper-outer quadrant of left female breast: Secondary | ICD-10-CM | POA: Diagnosis present

## 2015-12-21 NOTE — Progress Notes (Signed)
States is feeling nervous. Offers no complaints.

## 2015-12-22 ENCOUNTER — Ambulatory Visit
Admission: RE | Admit: 2015-12-22 | Discharge: 2015-12-22 | Disposition: A | Discharge status: Home / Self Care | Payer: Medicare Other | Source: Ambulatory Visit | Attending: Radiation Oncology | Admitting: Radiation Oncology

## 2015-12-22 DIAGNOSIS — Z510 Encounter for antineoplastic radiation therapy: Secondary | ICD-10-CM | POA: Diagnosis not present

## 2015-12-26 DIAGNOSIS — Z170 Estrogen receptor positive status [ER+]: Secondary | ICD-10-CM | POA: Diagnosis not present

## 2015-12-26 DIAGNOSIS — I10 Essential (primary) hypertension: Secondary | ICD-10-CM | POA: Diagnosis not present

## 2015-12-26 DIAGNOSIS — Z9012 Acquired absence of left breast and nipple: Secondary | ICD-10-CM | POA: Diagnosis not present

## 2015-12-26 DIAGNOSIS — M129 Arthropathy, unspecified: Secondary | ICD-10-CM | POA: Diagnosis not present

## 2015-12-26 DIAGNOSIS — Z87440 Personal history of urinary (tract) infections: Secondary | ICD-10-CM | POA: Diagnosis not present

## 2015-12-26 DIAGNOSIS — F329 Major depressive disorder, single episode, unspecified: Secondary | ICD-10-CM | POA: Diagnosis not present

## 2015-12-26 DIAGNOSIS — C50412 Malignant neoplasm of upper-outer quadrant of left female breast: Secondary | ICD-10-CM | POA: Diagnosis not present

## 2015-12-26 DIAGNOSIS — K219 Gastro-esophageal reflux disease without esophagitis: Secondary | ICD-10-CM | POA: Diagnosis not present

## 2015-12-26 DIAGNOSIS — Z510 Encounter for antineoplastic radiation therapy: Secondary | ICD-10-CM | POA: Diagnosis not present

## 2015-12-26 DIAGNOSIS — R51 Headache: Secondary | ICD-10-CM | POA: Diagnosis not present

## 2015-12-26 DIAGNOSIS — Z79899 Other long term (current) drug therapy: Secondary | ICD-10-CM | POA: Diagnosis not present

## 2015-12-30 ENCOUNTER — Other Ambulatory Visit: Payer: Self-pay | Admitting: *Deleted

## 2015-12-30 DIAGNOSIS — C50412 Malignant neoplasm of upper-outer quadrant of left female breast: Secondary | ICD-10-CM

## 2016-01-02 ENCOUNTER — Ambulatory Visit
Admission: RE | Admit: 2016-01-02 | Discharge: 2016-01-02 | Disposition: A | Discharge status: Home / Self Care | Payer: Medicare Other | Source: Ambulatory Visit | Attending: Radiation Oncology | Admitting: Radiation Oncology

## 2016-01-02 DIAGNOSIS — Z510 Encounter for antineoplastic radiation therapy: Secondary | ICD-10-CM | POA: Diagnosis not present

## 2016-01-03 ENCOUNTER — Ambulatory Visit
Admission: RE | Admit: 2016-01-03 | Discharge: 2016-01-03 | Disposition: A | Discharge status: Home / Self Care | Payer: Medicare Other | Source: Ambulatory Visit | Attending: Radiation Oncology | Admitting: Radiation Oncology

## 2016-01-03 DIAGNOSIS — Z510 Encounter for antineoplastic radiation therapy: Secondary | ICD-10-CM | POA: Diagnosis not present

## 2016-01-04 ENCOUNTER — Ambulatory Visit
Admission: RE | Admit: 2016-01-04 | Discharge: 2016-01-04 | Disposition: A | Discharge status: Home / Self Care | Payer: Medicare Other | Source: Ambulatory Visit | Attending: Radiation Oncology | Admitting: Radiation Oncology

## 2016-01-04 DIAGNOSIS — Z510 Encounter for antineoplastic radiation therapy: Secondary | ICD-10-CM | POA: Diagnosis not present

## 2016-01-05 ENCOUNTER — Ambulatory Visit
Admission: RE | Admit: 2016-01-05 | Discharge: 2016-01-05 | Disposition: A | Discharge status: Home / Self Care | Payer: Medicare Other | Source: Ambulatory Visit | Attending: Radiation Oncology | Admitting: Radiation Oncology

## 2016-01-05 DIAGNOSIS — Z510 Encounter for antineoplastic radiation therapy: Secondary | ICD-10-CM | POA: Diagnosis not present

## 2016-01-06 ENCOUNTER — Ambulatory Visit
Admission: RE | Admit: 2016-01-06 | Discharge: 2016-01-06 | Disposition: A | Discharge status: Home / Self Care | Payer: Medicare Other | Source: Ambulatory Visit | Attending: Radiation Oncology | Admitting: Radiation Oncology

## 2016-01-06 DIAGNOSIS — Z510 Encounter for antineoplastic radiation therapy: Secondary | ICD-10-CM | POA: Diagnosis not present

## 2016-01-09 ENCOUNTER — Ambulatory Visit
Admission: RE | Admit: 2016-01-09 | Discharge: 2016-01-09 | Disposition: A | Discharge status: Home / Self Care | Payer: Medicare Other | Source: Ambulatory Visit | Attending: Radiation Oncology | Admitting: Radiation Oncology

## 2016-01-09 ENCOUNTER — Encounter: Payer: Self-pay | Admitting: *Deleted

## 2016-01-09 DIAGNOSIS — Z510 Encounter for antineoplastic radiation therapy: Secondary | ICD-10-CM | POA: Diagnosis not present

## 2016-01-10 ENCOUNTER — Ambulatory Visit
Admission: RE | Admit: 2016-01-10 | Discharge: 2016-01-10 | Disposition: A | Discharge status: Home / Self Care | Payer: Medicare Other | Source: Ambulatory Visit | Attending: Radiation Oncology | Admitting: Radiation Oncology

## 2016-01-10 DIAGNOSIS — Z510 Encounter for antineoplastic radiation therapy: Secondary | ICD-10-CM | POA: Diagnosis not present

## 2016-01-11 ENCOUNTER — Ambulatory Visit
Admission: RE | Admit: 2016-01-11 | Discharge: 2016-01-11 | Disposition: A | Discharge status: Home / Self Care | Payer: Medicare Other | Source: Ambulatory Visit | Attending: Radiation Oncology | Admitting: Radiation Oncology

## 2016-01-11 DIAGNOSIS — Z510 Encounter for antineoplastic radiation therapy: Secondary | ICD-10-CM | POA: Diagnosis not present

## 2016-01-12 ENCOUNTER — Ambulatory Visit
Admission: RE | Admit: 2016-01-12 | Discharge: 2016-01-12 | Disposition: A | Discharge status: Home / Self Care | Payer: Medicare Other | Source: Ambulatory Visit | Attending: Radiation Oncology | Admitting: Radiation Oncology

## 2016-01-12 DIAGNOSIS — Z510 Encounter for antineoplastic radiation therapy: Secondary | ICD-10-CM | POA: Diagnosis not present

## 2016-01-13 ENCOUNTER — Ambulatory Visit
Admission: RE | Admit: 2016-01-13 | Discharge: 2016-01-13 | Disposition: A | Discharge status: Home / Self Care | Payer: Medicare Other | Source: Ambulatory Visit | Attending: Radiation Oncology | Admitting: Radiation Oncology

## 2016-01-13 DIAGNOSIS — Z510 Encounter for antineoplastic radiation therapy: Secondary | ICD-10-CM | POA: Diagnosis not present

## 2016-01-16 ENCOUNTER — Ambulatory Visit
Admission: RE | Admit: 2016-01-16 | Discharge: 2016-01-16 | Disposition: A | Discharge status: Home / Self Care | Payer: Medicare Other | Source: Ambulatory Visit | Attending: Radiation Oncology | Admitting: Radiation Oncology

## 2016-01-16 DIAGNOSIS — Z510 Encounter for antineoplastic radiation therapy: Secondary | ICD-10-CM | POA: Diagnosis not present

## 2016-01-17 ENCOUNTER — Ambulatory Visit
Admission: RE | Admit: 2016-01-17 | Discharge: 2016-01-17 | Disposition: A | Discharge status: Home / Self Care | Payer: Medicare Other | Source: Ambulatory Visit | Attending: Radiation Oncology | Admitting: Radiation Oncology

## 2016-01-17 ENCOUNTER — Inpatient Hospital Stay: Payer: Medicare Other | Attending: Radiation Oncology

## 2016-01-17 DIAGNOSIS — Z510 Encounter for antineoplastic radiation therapy: Secondary | ICD-10-CM | POA: Diagnosis not present

## 2016-01-18 ENCOUNTER — Ambulatory Visit
Admission: RE | Admit: 2016-01-18 | Discharge: 2016-01-18 | Disposition: A | Discharge status: Home / Self Care | Payer: Medicare Other | Source: Ambulatory Visit | Attending: Radiation Oncology | Admitting: Radiation Oncology

## 2016-01-18 DIAGNOSIS — Z510 Encounter for antineoplastic radiation therapy: Secondary | ICD-10-CM | POA: Diagnosis not present

## 2016-01-19 ENCOUNTER — Ambulatory Visit
Admission: RE | Admit: 2016-01-19 | Discharge: 2016-01-19 | Disposition: A | Discharge status: Home / Self Care | Payer: Medicare Other | Source: Ambulatory Visit | Attending: Radiation Oncology | Admitting: Radiation Oncology

## 2016-01-19 DIAGNOSIS — Z510 Encounter for antineoplastic radiation therapy: Secondary | ICD-10-CM | POA: Diagnosis not present

## 2016-01-20 ENCOUNTER — Ambulatory Visit
Admission: RE | Admit: 2016-01-20 | Discharge: 2016-01-20 | Disposition: A | Discharge status: Home / Self Care | Payer: Medicare Other | Source: Ambulatory Visit | Attending: Radiation Oncology | Admitting: Radiation Oncology

## 2016-01-20 DIAGNOSIS — Z510 Encounter for antineoplastic radiation therapy: Secondary | ICD-10-CM | POA: Diagnosis not present

## 2016-01-23 ENCOUNTER — Ambulatory Visit
Admission: RE | Admit: 2016-01-23 | Discharge: 2016-01-23 | Disposition: A | Discharge status: Home / Self Care | Payer: Medicare Other | Source: Ambulatory Visit | Attending: Radiation Oncology | Admitting: Radiation Oncology

## 2016-01-23 DIAGNOSIS — Z510 Encounter for antineoplastic radiation therapy: Secondary | ICD-10-CM | POA: Diagnosis not present

## 2016-01-24 ENCOUNTER — Ambulatory Visit
Admission: RE | Admit: 2016-01-24 | Discharge: 2016-01-24 | Disposition: A | Discharge status: Home / Self Care | Payer: Medicare Other | Source: Ambulatory Visit | Attending: Radiation Oncology | Admitting: Radiation Oncology

## 2016-01-24 DIAGNOSIS — Z510 Encounter for antineoplastic radiation therapy: Secondary | ICD-10-CM | POA: Diagnosis not present

## 2016-01-25 ENCOUNTER — Ambulatory Visit
Admission: RE | Admit: 2016-01-25 | Discharge: 2016-01-25 | Disposition: A | Discharge status: Home / Self Care | Payer: Medicare Other | Source: Ambulatory Visit | Attending: Radiation Oncology | Admitting: Radiation Oncology

## 2016-01-25 DIAGNOSIS — R51 Headache: Secondary | ICD-10-CM | POA: Diagnosis not present

## 2016-01-25 DIAGNOSIS — F329 Major depressive disorder, single episode, unspecified: Secondary | ICD-10-CM | POA: Diagnosis not present

## 2016-01-25 DIAGNOSIS — M129 Arthropathy, unspecified: Secondary | ICD-10-CM | POA: Diagnosis not present

## 2016-01-25 DIAGNOSIS — Z87440 Personal history of urinary (tract) infections: Secondary | ICD-10-CM | POA: Diagnosis not present

## 2016-01-25 DIAGNOSIS — Z9012 Acquired absence of left breast and nipple: Secondary | ICD-10-CM | POA: Diagnosis not present

## 2016-01-25 DIAGNOSIS — Z170 Estrogen receptor positive status [ER+]: Secondary | ICD-10-CM | POA: Diagnosis not present

## 2016-01-25 DIAGNOSIS — C50412 Malignant neoplasm of upper-outer quadrant of left female breast: Secondary | ICD-10-CM | POA: Diagnosis not present

## 2016-01-25 DIAGNOSIS — I10 Essential (primary) hypertension: Secondary | ICD-10-CM | POA: Diagnosis not present

## 2016-01-25 DIAGNOSIS — Z510 Encounter for antineoplastic radiation therapy: Secondary | ICD-10-CM | POA: Diagnosis not present

## 2016-01-25 DIAGNOSIS — K219 Gastro-esophageal reflux disease without esophagitis: Secondary | ICD-10-CM | POA: Diagnosis not present

## 2016-01-25 DIAGNOSIS — Z79899 Other long term (current) drug therapy: Secondary | ICD-10-CM | POA: Diagnosis not present

## 2016-01-26 ENCOUNTER — Ambulatory Visit
Admission: RE | Admit: 2016-01-26 | Discharge: 2016-01-26 | Disposition: A | Discharge status: Home / Self Care | Payer: Medicare Other | Source: Ambulatory Visit | Attending: Radiation Oncology | Admitting: Radiation Oncology

## 2016-01-26 DIAGNOSIS — Z510 Encounter for antineoplastic radiation therapy: Secondary | ICD-10-CM | POA: Diagnosis not present

## 2016-01-27 ENCOUNTER — Ambulatory Visit
Admission: RE | Admit: 2016-01-27 | Discharge: 2016-01-27 | Disposition: A | Discharge status: Home / Self Care | Payer: Medicare Other | Source: Ambulatory Visit | Attending: Radiation Oncology | Admitting: Radiation Oncology

## 2016-01-27 DIAGNOSIS — Z510 Encounter for antineoplastic radiation therapy: Secondary | ICD-10-CM | POA: Diagnosis not present

## 2016-01-30 ENCOUNTER — Ambulatory Visit
Admission: RE | Admit: 2016-01-30 | Discharge: 2016-01-30 | Disposition: A | Discharge status: Home / Self Care | Payer: Medicare Other | Source: Ambulatory Visit | Attending: Radiation Oncology | Admitting: Radiation Oncology

## 2016-01-30 DIAGNOSIS — Z510 Encounter for antineoplastic radiation therapy: Secondary | ICD-10-CM | POA: Diagnosis not present

## 2016-01-31 ENCOUNTER — Inpatient Hospital Stay: Payer: Medicare Other | Attending: Radiation Oncology

## 2016-01-31 ENCOUNTER — Ambulatory Visit
Admission: RE | Admit: 2016-01-31 | Discharge: 2016-01-31 | Disposition: A | Discharge status: Home / Self Care | Payer: Medicare Other | Source: Ambulatory Visit | Attending: Radiation Oncology | Admitting: Radiation Oncology

## 2016-01-31 DIAGNOSIS — Z79810 Long term (current) use of selective estrogen receptor modulators (SERMs): Secondary | ICD-10-CM | POA: Insufficient documentation

## 2016-01-31 DIAGNOSIS — Z510 Encounter for antineoplastic radiation therapy: Secondary | ICD-10-CM | POA: Diagnosis not present

## 2016-01-31 DIAGNOSIS — Z170 Estrogen receptor positive status [ER+]: Secondary | ICD-10-CM | POA: Insufficient documentation

## 2016-01-31 DIAGNOSIS — C50412 Malignant neoplasm of upper-outer quadrant of left female breast: Secondary | ICD-10-CM | POA: Insufficient documentation

## 2016-02-01 ENCOUNTER — Other Ambulatory Visit: Payer: Self-pay | Admitting: *Deleted

## 2016-02-01 ENCOUNTER — Ambulatory Visit
Admission: RE | Admit: 2016-02-01 | Discharge: 2016-02-01 | Disposition: A | Discharge status: Home / Self Care | Payer: Medicare Other | Source: Ambulatory Visit | Attending: Radiation Oncology | Admitting: Radiation Oncology

## 2016-02-01 ENCOUNTER — Ambulatory Visit: Admission: RE | Admit: 2016-02-01 | Payer: Medicare Other | Source: Ambulatory Visit

## 2016-02-01 DIAGNOSIS — Z510 Encounter for antineoplastic radiation therapy: Secondary | ICD-10-CM | POA: Diagnosis not present

## 2016-02-01 MED ORDER — SILVER SULFADIAZINE 1 % EX CREA: 1.0000 "application " | TOPICAL_CREAM | Freq: Two times a day (BID) | CUTANEOUS | 2 refills | Status: DC

## 2016-02-02 ENCOUNTER — Ambulatory Visit: Payer: Medicare Other

## 2016-02-03 ENCOUNTER — Ambulatory Visit: Payer: Medicare Other

## 2016-02-06 ENCOUNTER — Ambulatory Visit
Admission: RE | Admit: 2016-02-06 | Discharge: 2016-02-06 | Disposition: A | Discharge status: Home / Self Care | Payer: Medicare Other | Source: Ambulatory Visit | Attending: Radiation Oncology | Admitting: Radiation Oncology

## 2016-02-06 ENCOUNTER — Encounter: Payer: Self-pay | Admitting: *Deleted

## 2016-02-06 DIAGNOSIS — Z510 Encounter for antineoplastic radiation therapy: Secondary | ICD-10-CM | POA: Diagnosis not present

## 2016-02-07 ENCOUNTER — Ambulatory Visit
Admission: RE | Admit: 2016-02-07 | Discharge: 2016-02-07 | Disposition: A | Discharge status: Home / Self Care | Payer: Medicare Other | Source: Ambulatory Visit | Attending: Radiation Oncology | Admitting: Radiation Oncology

## 2016-02-07 DIAGNOSIS — Z510 Encounter for antineoplastic radiation therapy: Secondary | ICD-10-CM | POA: Diagnosis not present

## 2016-02-08 ENCOUNTER — Ambulatory Visit
Admission: RE | Admit: 2016-02-08 | Discharge: 2016-02-08 | Disposition: A | Discharge status: Home / Self Care | Payer: Medicare Other | Source: Ambulatory Visit | Attending: Radiation Oncology | Admitting: Radiation Oncology

## 2016-02-08 DIAGNOSIS — Z510 Encounter for antineoplastic radiation therapy: Secondary | ICD-10-CM | POA: Diagnosis not present

## 2016-02-09 ENCOUNTER — Ambulatory Visit
Admission: RE | Admit: 2016-02-09 | Discharge: 2016-02-09 | Disposition: A | Discharge status: Home / Self Care | Payer: Medicare Other | Source: Ambulatory Visit | Attending: Radiation Oncology | Admitting: Radiation Oncology

## 2016-02-09 DIAGNOSIS — Z510 Encounter for antineoplastic radiation therapy: Secondary | ICD-10-CM | POA: Diagnosis not present

## 2016-02-10 ENCOUNTER — Ambulatory Visit: Payer: Medicare Other

## 2016-02-10 ENCOUNTER — Ambulatory Visit
Admission: RE | Admit: 2016-02-10 | Discharge: 2016-02-10 | Disposition: A | Discharge status: Home / Self Care | Payer: Medicare Other | Source: Ambulatory Visit | Attending: Radiation Oncology | Admitting: Radiation Oncology

## 2016-02-10 DIAGNOSIS — Z510 Encounter for antineoplastic radiation therapy: Secondary | ICD-10-CM | POA: Diagnosis not present

## 2016-02-13 ENCOUNTER — Ambulatory Visit: Payer: Medicare Other

## 2016-02-13 ENCOUNTER — Ambulatory Visit
Admission: RE | Admit: 2016-02-13 | Discharge: 2016-02-13 | Disposition: A | Discharge status: Home / Self Care | Payer: Medicare Other | Source: Ambulatory Visit | Attending: Radiation Oncology | Admitting: Radiation Oncology

## 2016-02-13 DIAGNOSIS — Z510 Encounter for antineoplastic radiation therapy: Secondary | ICD-10-CM | POA: Diagnosis not present

## 2016-02-14 ENCOUNTER — Ambulatory Visit: Payer: Medicare Other

## 2016-02-14 ENCOUNTER — Inpatient Hospital Stay: Payer: Medicare Other

## 2016-02-14 DIAGNOSIS — Z79810 Long term (current) use of selective estrogen receptor modulators (SERMs): Secondary | ICD-10-CM | POA: Diagnosis not present

## 2016-02-14 DIAGNOSIS — Z170 Estrogen receptor positive status [ER+]: Secondary | ICD-10-CM | POA: Diagnosis not present

## 2016-02-14 DIAGNOSIS — C50412 Malignant neoplasm of upper-outer quadrant of left female breast: Secondary | ICD-10-CM | POA: Diagnosis present

## 2016-02-14 DIAGNOSIS — Z510 Encounter for antineoplastic radiation therapy: Secondary | ICD-10-CM | POA: Diagnosis not present

## 2016-02-14 LAB — CBC
HEMATOCRIT: 37.5 % (ref 35.0–47.0)
HEMOGLOBIN: 12.6 g/dL (ref 12.0–16.0)
MCH: 27.9 pg (ref 26.0–34.0)
MCHC: 33.6 g/dL (ref 32.0–36.0)
MCV: 83.1 fL (ref 80.0–100.0)
Platelets: 198 10*3/uL (ref 150–440)
RBC: 4.51 MIL/uL (ref 3.80–5.20)
RDW: 14.7 % — AB (ref 11.5–14.5)
WBC: 5.5 10*3/uL (ref 3.6–11.0)

## 2016-02-15 ENCOUNTER — Ambulatory Visit
Admission: RE | Admit: 2016-02-15 | Discharge: 2016-02-15 | Disposition: A | Discharge status: Home / Self Care | Payer: Medicare Other | Source: Ambulatory Visit | Attending: Radiation Oncology | Admitting: Radiation Oncology

## 2016-02-15 DIAGNOSIS — Z510 Encounter for antineoplastic radiation therapy: Secondary | ICD-10-CM | POA: Diagnosis not present

## 2016-02-20 ENCOUNTER — Inpatient Hospital Stay: Payer: Medicare Other | Admitting: Oncology

## 2016-02-20 ENCOUNTER — Ambulatory Visit
Admission: RE | Admit: 2016-02-20 | Discharge: 2016-02-20 | Disposition: A | Discharge status: Home / Self Care | Payer: Medicare Other | Source: Ambulatory Visit | Attending: Radiation Oncology | Admitting: Radiation Oncology

## 2016-02-20 DIAGNOSIS — Z510 Encounter for antineoplastic radiation therapy: Secondary | ICD-10-CM | POA: Diagnosis not present

## 2016-02-20 NOTE — Progress Notes (Deleted)
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 Low risk Oncotype Dx of 15.  INTERVAL HISTORY: Patient returns to clinic today for further evaluation and discussion of her Oncotype score. She currently feels well and is asymptomatic. She has no neurologic complaints. She denies any recent fevers or illnesses. She has a good appetite and denies weight loss. She denies any chest pain or shortness of breath. She denies any nausea, vomiting, constipation, or diarrhea. She has no urinary complaints. Patient offers no specific complaints today.  REVIEW OF SYSTEMS:   Review of Systems  Constitutional: Negative.  Negative for fever, malaise/fatigue and weight loss.  Respiratory: Negative.  Negative for cough and shortness of breath.   Cardiovascular: Negative.  Negative for chest pain.  Gastrointestinal: Negative.  Negative for abdominal pain.  Genitourinary: Negative.   Musculoskeletal: Negative.   Neurological: Negative.  Negative for weakness.  Psychiatric/Behavioral: Negative.     As per HPI. Otherwise, a complete review of systems is negative.  PAST MEDICAL HISTORY: Past Medical History:  Diagnosis Date  . Arthritis   . Depression   . GERD (gastroesophageal reflux disease)   . Headache   . Hypertension   . Post-menopausal   . UTI (urinary tract infection)     PAST SURGICAL HISTORY: Past Surgical History:  Procedure Laterality Date  . COLONOSCOPY  2007  . PARTIAL MASTECTOMY WITH NEEDLE LOCALIZATION Left 11/25/2015   Procedure: PARTIAL MASTECTOMY WITH NEEDLE LOCALIZATION;  Surgeon: Leonie Green, MD;  Location: [facility] ORS;  Service: General;  Laterality: Left;  .  SENTINEL NODE BIOPSY Left 11/25/2015   Procedure: SENTINEL NODE BIOPSY;  Surgeon: Leonie Green, MD;  Location: [facility] ORS;  Service: General;  Laterality: Left;  . TUBAL LIGATION      FAMILY HISTORY: Reviewed and unchanged. No reported history of malignancy or chronic disease.     ADVANCED DIRECTIVES (Y/N):  N   HEALTH MAINTENANCE: Social History  Substance Use Topics  . Smoking status: Never Smoker  . Smokeless tobacco: Never Used  . Alcohol use No     Colonoscopy:  PAP:  Bone density:  Lipid panel:  No Known Allergies  Current Outpatient Prescriptions  Medication Sig Dispense Refill  . amLODipine (NORVASC) 5 MG tablet Take 5 mg by mouth daily.    . bisoprolol-hydrochlorothiazide (ZIAC) 10-6.25 MG tablet Take 1 tablet by mouth 2 (two) times daily.    . calcium carbonate (OS-CAL - DOSED IN MG OF ELEMENTAL CALCIUM) 1250 (500 Ca) MG tablet Take by mouth.    . Cholecalciferol (VITAMIN D-1000 MAX ST) 1000 units tablet Take by mouth.    [name] HYDROcodone-acetaminophen (NORCO) 5-325 MG tablet Take 1-2 tablets by mouth every 4 (four) hours as needed for moderate pain. 12 tablet 0  . ibuprofen (ADVIL,MOTRIN) 200 MG tablet Take 1 tablet by mouth every 8 (eight) hours as needed.    [name] lisinopril (PRINIVIL,ZESTRIL) 40 MG tablet Take 40 mg by mouth at bedtime.    [name] loratadine (CLARITIN) 10 MG tablet Take 10 mg by mouth daily.    . pravastatin (PRAVACHOL) 20 MG tablet Take 20 mg by mouth at bedtime.    . senna (SENOKOT) 8.6 MG TABS tablet Take 1-2 tablets by mouth at bedtime.    [name]  sertraline (ZOLOFT) 25 MG tablet Take 25 mg by mouth daily.    . silver sulfADIAZINE (SILVADENE) 1 % cream Apply 1 application topically 2 (two) times daily. 50 g 2   No current facility-administered medications for this visit.     OBJECTIVE: There were no vitals filed for this visit.   There is no height or weight on file to calculate BMI.    ECOG FS:0 - Asymptomatic  General: Well-developed,  well-nourished, no acute distress. Eyes: Pink conjunctiva, anicteric sclera. Breasts: Patient requested exam be deferred today. Lungs: Clear to auscultation bilaterally. Heart: Regular rate and rhythm. No rubs, murmurs, or gallops. Abdomen: Soft, nontender, nondistended. No organomegaly noted, normoactive bowel sounds. Musculoskeletal: No edema, cyanosis, or clubbing. Neuro: Alert, answering all questions appropriately. Cranial nerves grossly intact. Skin: No rashes or petechiae noted. Psych: Normal affect.   LAB RESULTS:  No results found for: NA, K, CL, CO2, GLUCOSE, BUN, CREATININE, CALCIUM, PROT, ALBUMIN, AST, ALT, ALKPHOS, BILITOT, GFRNONAA, GFRAA  Lab Results  Component Value Date   WBC 5.5 02/14/2016   HGB 12.6 02/14/2016   HCT 37.5 02/14/2016   MCV 83.1 02/14/2016   PLT 198 02/14/2016     STUDIES: No results found.  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 Low risk Oncotype DX and 15.  PLAN:    1. 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 Her Oncotype score is 15 which is considered low risk, therefore she does not require adjuvant chemotherapy. Patient will initiate adjuvant XRT on January 02, 2016. Return to clinic in 2 months at the conclusion of her XRT for further evaluation and initiation of an aromatase inhibitor or tamoxifen.  2. Osteoporosis: Patient's bone mineral density on October 25, 2015 revealed a T score of -2.6 which is considered osteoporotic. Although the recommendation for postmenopausal women is an aromatase inhibitor, this puts her at increased risk for osteoporosis therefore she may be placed on tamoxifen for 5 years. Will repeat bone marrow density prior to initiating treatment.  Approximately 30 minutes was spent in  discussion of which greater than 50% was consultation.  The entire visit was not done in the presence of an interpreter.  Patient expressed understanding and was in agreement with this plan. She also understands that She can call clinic at any time with any questions, concerns, or complaints.   Primary cancer of upper outer quadrant of left female breast [facility])   Staging form: Breast, AJCC 7th Edition   - Clinical stage from 11/16/2015: Stage IA (T1b, N0, M0) - Signed by Lloyd Huger, MD on 11/17/2015  Lloyd Huger, MD   02/20/2016 9:03 AM

## 2016-02-21 ENCOUNTER — Ambulatory Visit
Admission: RE | Admit: 2016-02-21 | Discharge: 2016-02-21 | Disposition: A | Discharge status: Home / Self Care | Payer: Medicare Other | Source: Ambulatory Visit | Attending: Radiation Oncology | Admitting: Radiation Oncology

## 2016-02-21 DIAGNOSIS — Z510 Encounter for antineoplastic radiation therapy: Secondary | ICD-10-CM | POA: Diagnosis not present

## 2016-02-22 ENCOUNTER — Ambulatory Visit
Admission: RE | Admit: 2016-02-22 | Discharge: 2016-02-22 | Disposition: A | Discharge status: Home / Self Care | Payer: Medicare Other | Source: Ambulatory Visit | Attending: Radiation Oncology | Admitting: Radiation Oncology

## 2016-02-22 DIAGNOSIS — Z510 Encounter for antineoplastic radiation therapy: Secondary | ICD-10-CM | POA: Diagnosis not present

## 2016-02-23 ENCOUNTER — Ambulatory Visit
Admission: RE | Admit: 2016-02-23 | Discharge: 2016-02-23 | Disposition: A | Discharge status: Home / Self Care | Payer: Medicare Other | Source: Ambulatory Visit | Attending: Radiation Oncology | Admitting: Radiation Oncology

## 2016-02-23 ENCOUNTER — Ambulatory Visit: Payer: Medicare Other

## 2016-02-23 DIAGNOSIS — Z510 Encounter for antineoplastic radiation therapy: Secondary | ICD-10-CM | POA: Diagnosis not present

## 2016-02-24 ENCOUNTER — Ambulatory Visit
Admission: RE | Admit: 2016-02-24 | Discharge: 2016-02-24 | Disposition: A | Discharge status: Home / Self Care | Payer: Medicare Other | Source: Ambulatory Visit | Attending: Radiation Oncology | Admitting: Radiation Oncology

## 2016-02-24 ENCOUNTER — Ambulatory Visit: Payer: Medicare Other

## 2016-02-24 DIAGNOSIS — Z79899 Other long term (current) drug therapy: Secondary | ICD-10-CM | POA: Diagnosis not present

## 2016-02-24 DIAGNOSIS — C50412 Malignant neoplasm of upper-outer quadrant of left female breast: Secondary | ICD-10-CM | POA: Diagnosis not present

## 2016-02-24 DIAGNOSIS — F329 Major depressive disorder, single episode, unspecified: Secondary | ICD-10-CM | POA: Diagnosis not present

## 2016-02-24 DIAGNOSIS — I10 Essential (primary) hypertension: Secondary | ICD-10-CM | POA: Diagnosis not present

## 2016-02-24 DIAGNOSIS — Z510 Encounter for antineoplastic radiation therapy: Secondary | ICD-10-CM | POA: Diagnosis not present

## 2016-02-24 DIAGNOSIS — Z170 Estrogen receptor positive status [ER+]: Secondary | ICD-10-CM | POA: Diagnosis not present

## 2016-02-24 DIAGNOSIS — M129 Arthropathy, unspecified: Secondary | ICD-10-CM | POA: Diagnosis not present

## 2016-02-24 DIAGNOSIS — Z9012 Acquired absence of left breast and nipple: Secondary | ICD-10-CM | POA: Diagnosis not present

## 2016-02-24 DIAGNOSIS — R51 Headache: Secondary | ICD-10-CM | POA: Diagnosis not present

## 2016-02-24 DIAGNOSIS — K219 Gastro-esophageal reflux disease without esophagitis: Secondary | ICD-10-CM | POA: Diagnosis not present

## 2016-02-24 DIAGNOSIS — Z87440 Personal history of urinary (tract) infections: Secondary | ICD-10-CM | POA: Diagnosis not present

## 2016-02-27 ENCOUNTER — Ambulatory Visit: Payer: Medicare Other

## 2016-02-27 DIAGNOSIS — Z510 Encounter for antineoplastic radiation therapy: Secondary | ICD-10-CM | POA: Diagnosis not present

## 2016-02-28 ENCOUNTER — Ambulatory Visit
Admission: RE | Admit: 2016-02-28 | Discharge: 2016-02-28 | Disposition: A | Discharge status: Home / Self Care | Payer: Medicare Other | Source: Ambulatory Visit | Attending: Radiation Oncology | Admitting: Radiation Oncology

## 2016-02-28 ENCOUNTER — Ambulatory Visit: Payer: Medicare Other

## 2016-02-28 DIAGNOSIS — Z510 Encounter for antineoplastic radiation therapy: Secondary | ICD-10-CM | POA: Diagnosis not present

## 2016-02-29 ENCOUNTER — Ambulatory Visit: Payer: Medicare Other

## 2016-03-04 NOTE — Progress Notes (Signed)
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 Low risk Oncotype Dx of 15.  INTERVAL HISTORY: Patient returns to clinic today for further evaluation and initiation of letrozole. She tolerated her XRT well without significant side effects. She currently feels well and is asymptomatic. She has no neurologic complaints. She denies any recent fevers or illnesses. She has a good appetite and denies weight loss. She denies any chest pain or shortness of breath. She denies any nausea, vomiting, constipation, or diarrhea. She has no urinary complaints. Patient offers no specific complaints today.  REVIEW OF SYSTEMS:   Review of Systems  Constitutional: Negative.  Negative for fever, malaise/fatigue and weight loss.  Respiratory: Negative.  Negative for cough and shortness of breath.   Cardiovascular: Negative.  Negative for chest pain.  Gastrointestinal: Negative.  Negative for abdominal pain.  Genitourinary: Negative.   Musculoskeletal: Negative.   Neurological: Negative.  Negative for weakness.  Psychiatric/Behavioral: Negative.     As per HPI. Otherwise, a complete review of systems is negative.  PAST MEDICAL HISTORY: Past Medical History:  Diagnosis Date  . Arthritis   . Depression   . GERD (gastroesophageal reflux disease)   . Headache   . Hypertension   . Post-menopausal   . UTI (urinary tract infection)     PAST SURGICAL HISTORY: Past Surgical History:  Procedure Laterality Date  . COLONOSCOPY  2007  . PARTIAL MASTECTOMY WITH NEEDLE LOCALIZATION Left 11/25/2015   Procedure: PARTIAL MASTECTOMY WITH NEEDLE LOCALIZATION;  Surgeon: Leonie Green, MD;  Location: [facility]  ORS;  Service: General;  Laterality: Left;  . SENTINEL NODE BIOPSY Left 11/25/2015   Procedure: SENTINEL NODE BIOPSY;  Surgeon: Leonie Green, MD;  Location: [facility] ORS;  Service: General;  Laterality: Left;  . TUBAL LIGATION      FAMILY HISTORY: Reviewed and unchanged. No reported history of malignancy or chronic disease.     ADVANCED DIRECTIVES (Y/N):  N   HEALTH MAINTENANCE: Social History  Substance Use Topics  . Smoking status: Never Smoker  . Smokeless tobacco: Never Used  . Alcohol use No     Colonoscopy:  PAP:  Bone density:  Lipid panel:  No Known Allergies  Current Outpatient Prescriptions  Medication Sig Dispense Refill  . amLODipine (NORVASC) 5 MG tablet Take 5 mg by mouth daily.    . bisoprolol-hydrochlorothiazide (ZIAC) 10-6.25 MG tablet Take 1 tablet by mouth 2 (two) times daily.    . calcium carbonate (OS-CAL - DOSED IN MG OF ELEMENTAL CALCIUM) 1250 (500 Ca) MG tablet Take by mouth.    . Cholecalciferol (VITAMIN D-1000 MAX ST) 1000 units tablet Take by mouth.    [name] HYDROcodone-acetaminophen (NORCO) 5-325 MG tablet Take 1-2 tablets by mouth every 4 (four) hours as needed for moderate pain. 12 tablet 0  . ibuprofen (ADVIL,MOTRIN) 200 MG tablet Take 1 tablet by mouth every 8 (eight) hours as needed.    [name] lisinopril (PRINIVIL,ZESTRIL) 40 MG tablet Take 40 mg by mouth at bedtime.    [name] loratadine (CLARITIN) 10 MG tablet Take 10 mg by mouth daily.    . pravastatin (PRAVACHOL) 20 MG tablet Take 20 mg by mouth at bedtime.    . senna (SENOKOT) 8.6 MG TABS tablet  Take 1-2 tablets by mouth at bedtime.    . sertraline (ZOLOFT) 25 MG tablet Take 25 mg by mouth daily.    . silver sulfADIAZINE (SILVADENE) 1 % cream Apply 1 application topically 2 (two) times daily. 50 g 2   No current facility-administered medications for this visit.     OBJECTIVE: Vitals:   03/06/16 1208  BP: 138/82  Pulse: 69  Resp: 18  Temp: 97.5 F (36.4 C)     Body mass index is 32.33 kg/m.     ECOG FS:0 - Asymptomatic  General: Well-developed, well-nourished, no acute distress. Eyes: Pink conjunctiva, anicteric sclera. Breasts: Patient requested exam be deferred today. Lungs: Clear to auscultation bilaterally. Heart: Regular rate and rhythm. No rubs, murmurs, or gallops. Abdomen: Soft, nontender, nondistended. No organomegaly noted, normoactive bowel sounds. Musculoskeletal: No edema, cyanosis, or clubbing. Neuro: Alert, answering all questions appropriately. Cranial nerves grossly intact. Skin: No rashes or petechiae noted. Psych: Normal affect.   LAB RESULTS:  No results found for: NA, K, CL, CO2, GLUCOSE, BUN, CREATININE, CALCIUM, PROT, ALBUMIN, AST, ALT, ALKPHOS, BILITOT, GFRNONAA, GFRAA  Lab Results  Component Value Date   WBC 5.5 02/14/2016   HGB 12.6 02/14/2016   HCT 37.5 02/14/2016   MCV 83.1 02/14/2016   PLT 198 02/14/2016     STUDIES: No results found.  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 Low risk Oncotype DX and 15.  PLAN:    1. 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 Given her low risk Oncotype, she did not require adjuvant chemotherapy. Proceed with letrozole which she will take for 5 years completing in December 2022. Return to clinic in 3 months for further evaluation.   2. Osteoporosis: Patient's bone mineral density on October 25, 2015 revealed a T score of -2.6 which is considered osteoporotic. Will repeat bone marrow density prior to initiating treatment.  Approximately 30 minutes was spent in discussion of which greater than 50% was consultation.  The entire visit was not done in the presence of an interpreter.  Patient expressed understanding and was in agreement with this plan. She also understands that She can call clinic at  any time with any questions, concerns, or complaints.   Primary cancer of upper outer quadrant of left female breast [facility])   Staging form: Breast, AJCC 7th Edition   - Clinical stage from 11/16/2015: Stage IA (T1b, N0, M0) - Signed by Lloyd Huger, MD on 11/17/2015  Lloyd Huger, MD   03/06/2016 12:15 PM

## 2016-03-06 ENCOUNTER — Inpatient Hospital Stay: Payer: Medicare Other | Attending: Oncology | Admitting: Oncology

## 2016-03-06 VITALS — BP 138/82 | HR 69 | Temp 97.5°F | Resp 18 | Wt 160.1 lb

## 2016-03-06 DIAGNOSIS — M818 Other osteoporosis without current pathological fracture: Secondary | ICD-10-CM

## 2016-03-06 DIAGNOSIS — Z79811 Long term (current) use of aromatase inhibitors: Secondary | ICD-10-CM

## 2016-03-06 DIAGNOSIS — M129 Arthropathy, unspecified: Secondary | ICD-10-CM | POA: Diagnosis not present

## 2016-03-06 DIAGNOSIS — F329 Major depressive disorder, single episode, unspecified: Secondary | ICD-10-CM | POA: Diagnosis not present

## 2016-03-06 DIAGNOSIS — C50412 Malignant neoplasm of upper-outer quadrant of left female breast: Secondary | ICD-10-CM | POA: Diagnosis not present

## 2016-03-06 DIAGNOSIS — R51 Headache: Secondary | ICD-10-CM

## 2016-03-06 DIAGNOSIS — Z170 Estrogen receptor positive status [ER+]: Secondary | ICD-10-CM

## 2016-03-06 DIAGNOSIS — Z87442 Personal history of urinary calculi: Secondary | ICD-10-CM

## 2016-03-06 DIAGNOSIS — K219 Gastro-esophageal reflux disease without esophagitis: Secondary | ICD-10-CM

## 2016-03-06 DIAGNOSIS — Z79899 Other long term (current) drug therapy: Secondary | ICD-10-CM | POA: Diagnosis not present

## 2016-03-06 DIAGNOSIS — I10 Essential (primary) hypertension: Secondary | ICD-10-CM

## 2016-03-06 DIAGNOSIS — Z923 Personal history of irradiation: Secondary | ICD-10-CM | POA: Diagnosis not present

## 2016-03-06 DIAGNOSIS — Z9012 Acquired absence of left breast and nipple: Secondary | ICD-10-CM

## 2016-03-06 MED ORDER — LETROZOLE 2.5 MG PO TABS: 2.5000 mg | ORAL_TABLET | Freq: Every day | ORAL | 3 refills | Status: DC

## 2016-03-06 NOTE — Progress Notes (Signed)
Offers no complaints  

## 2016-04-02 ENCOUNTER — Ambulatory Visit
Admission: RE | Admit: 2016-04-02 | Discharge: 2016-04-02 | Disposition: A | Discharge status: Home / Self Care | Payer: Medicare Other | Source: Ambulatory Visit | Attending: Radiation Oncology | Admitting: Radiation Oncology

## 2016-04-02 ENCOUNTER — Encounter: Payer: Self-pay | Admitting: Radiation Oncology

## 2016-04-02 VITALS — BP 142/85 | HR 67 | Temp 97.0°F | Resp 18 | Wt 158.3 lb

## 2016-04-02 DIAGNOSIS — C50912 Malignant neoplasm of unspecified site of left female breast: Secondary | ICD-10-CM | POA: Diagnosis not present

## 2016-04-02 DIAGNOSIS — Z923 Personal history of irradiation: Secondary | ICD-10-CM | POA: Diagnosis not present

## 2016-04-02 DIAGNOSIS — Z79811 Long term (current) use of aromatase inhibitors: Secondary | ICD-10-CM | POA: Diagnosis not present

## 2016-04-02 DIAGNOSIS — C50412 Malignant neoplasm of upper-outer quadrant of left female breast: Secondary | ICD-10-CM

## 2016-04-02 DIAGNOSIS — Z170 Estrogen receptor positive status [ER+]: Secondary | ICD-10-CM | POA: Insufficient documentation

## 2016-04-02 NOTE — Progress Notes (Signed)
Radiation Oncology Follow up Note  Name: Jasmine Gardner   Date:   04/02/2016 MRN:  [id number] DOB: 03-11-1951    This 66 y.o. female presents to the clinic today for one-month follow-up status post whole breast radiation to her left breast for stage I invasive mammary carcinoma.  REFERRING PROVIDER: [facility]  HPI: Patient is a 66 year old female of [place] descent seen today one month out having completed whole breast radiation to her left breast for a T1 N0 M0 invasive mammary carcinoma left breast status post wide local excision and sentinel node biopsy.. Tumor was ER/PR positive HER-2/neu negative. She is currently on Femara tolerating that well with some slight exacerbation of her joint pain. She specifically denies breast tenderness cough or bone pain.  COMPLICATIONS OF TREATMENT: none  FOLLOW UP COMPLIANCE: keeps appointments   PHYSICAL EXAM:  BP (!) 142/85   Pulse 67   Temp 97 F (36.1 C)   Resp 18   Wt 158 lb 4.6 oz (71.8 kg)   BMI 31.97 kg/m  Lungs are clear to A&P cardiac examination essentially unremarkable with regular rate and rhythm. No dominant mass or nodularity is noted in either breast in 2 positions examined. Incision is well-healed. No axillary or supraclavicular adenopathy is appreciated. Cosmetic result is excellent. Well-developed well-nourished patient in NAD. HEENT reveals PERLA, EOMI, discs not visualized.  Oral cavity is clear. No oral mucosal lesions are identified. Neck is clear without evidence of cervical or supraclavicular adenopathy. Lungs are clear to A&P. Cardiac examination is essentially unremarkable with regular rate and rhythm without murmur rub or thrill. Abdomen is benign with no organomegaly or masses noted. Motor sensory and DTR levels are equal and symmetric in the upper and lower extremities. Cranial nerves II through XII are grossly intact. Proprioception is intact. No peripheral adenopathy or edema is identified. No motor or  sensory levels are noted. Crude visual fields are within normal range.  RADIOLOGY RESULTS: No current films for review  PLAN: Present time she is doing well. She is recovering nicely from her surgery and radiation. She continues on Femara without significant side effect. I'm please were overall progress. I've asked to see her back in 4-5 months for follow-up. She can knows to call sooner with any concerns. Interpreter was present throughout our examination today.  I would like to take this opportunity to thank you for allowing me to participate in the care of your patient.Armstead Peaks., MD

## 2016-06-03 NOTE — Progress Notes (Signed)
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 Low risk Oncotype Dx of 15.  INTERVAL HISTORY: Patient returns to clinic today for routine 3 month evaluation. Since initiating letrozole, patient complains of headache, neck pain, and difficulty sleeping. She states she does not have any interest in her usual activities and also reports some memory loss. She otherwise feels well. She has no other neurologic complaints. She denies any recent fevers or illnesses. She has a good appetite and denies weight loss. She denies any chest pain or shortness of breath. She denies any nausea, vomiting, constipation, or diarrhea. She has no urinary complaints. Patient offers no further specific complaints today.  REVIEW OF SYSTEMS:   Review of Systems  Constitutional: Negative.  Negative for fever, malaise/fatigue and weight loss.  Respiratory: Negative.  Negative for cough and shortness of breath.   Cardiovascular: Negative.  Negative for chest pain and leg swelling.  Gastrointestinal: Negative.  Negative for abdominal pain.  Genitourinary: Negative.   Musculoskeletal: Positive for neck pain.  Neurological: Positive for headaches. Negative for weakness.  Psychiatric/Behavioral: Positive for depression. The patient is not nervous/anxious.     As per HPI. Otherwise, a complete review of systems is negative.  PAST MEDICAL HISTORY: Past Medical History:  Diagnosis Date  . Arthritis   . Depression   . GERD (gastroesophageal reflux disease)   . Headache   . Hypertension   . Post-menopausal   . UTI (urinary tract infection)     PAST SURGICAL HISTORY: Past Surgical History:  Procedure Laterality Date  .  COLONOSCOPY  2007  . PARTIAL MASTECTOMY WITH NEEDLE LOCALIZATION Left 11/25/2015   Procedure: PARTIAL MASTECTOMY WITH NEEDLE LOCALIZATION;  Surgeon: Leonie Green, MD;  Location: [facility] ORS;  Service: General;  Laterality: Left;  . SENTINEL NODE BIOPSY Left 11/25/2015   Procedure: SENTINEL NODE BIOPSY;  Surgeon: Leonie Green, MD;  Location: [facility] ORS;  Service: General;  Laterality: Left;  . TUBAL LIGATION      FAMILY HISTORY: Reviewed and unchanged. No reported history of malignancy or chronic disease.     ADVANCED DIRECTIVES (Y/N):  N   HEALTH MAINTENANCE: Social History  Substance Use Topics  . Smoking status: Never Smoker  . Smokeless tobacco: Never Used  . Alcohol use No     Colonoscopy:  PAP:  Bone density:  Lipid panel:  No Known Allergies  Current Outpatient Prescriptions  Medication Sig Dispense Refill  . amLODipine (NORVASC) 5 MG tablet Take 5 mg by mouth daily.    . bisoprolol-hydrochlorothiazide (ZIAC) 10-6.25 MG tablet Take 1 tablet by mouth 2 (two) times daily.    [name] letrozole (FEMARA) 2.5 MG tablet Take 1 tablet (2.5 mg total) by mouth daily. 30 tablet 3  . lisinopril (PRINIVIL,ZESTRIL) 40 MG tablet Take 40 mg by mouth at bedtime.    . pravastatin (PRAVACHOL) 20 MG tablet Take 20 mg by mouth at bedtime.    . sertraline (ZOLOFT) 25 MG tablet Take 25 mg by mouth daily.     No current facility-administered medications for this visit.     OBJECTIVE: Vitals:   06/04/16 1010  BP: 135/85  Pulse: 67  Resp: 18  Temp: (!) 96.4 F (35.8 C)     Body  mass index is 32.42 kg/m.    ECOG FS:0 - Asymptomatic  General: Well-developed, well-nourished, no acute distress. Eyes: Pink conjunctiva, anicteric sclera. Breasts: Patient requested exam be deferred today. Lungs: Clear to auscultation bilaterally. Heart: Regular rate and rhythm. No rubs, murmurs, or gallops. Abdomen: Soft, nontender, nondistended. No organomegaly noted, normoactive bowel  sounds. Musculoskeletal: No edema, cyanosis, or clubbing. Neuro: Alert, answering all questions appropriately. Cranial nerves grossly intact. Skin: No rashes or petechiae noted. Psych: Normal affect.   LAB RESULTS:  No results found for: NA, K, CL, CO2, GLUCOSE, BUN, CREATININE, CALCIUM, PROT, ALBUMIN, AST, ALT, ALKPHOS, BILITOT, GFRNONAA, GFRAA  Lab Results  Component Value Date   WBC 5.5 02/14/2016   HGB 12.6 02/14/2016   HCT 37.5 02/14/2016   MCV 83.1 02/14/2016   PLT 198 02/14/2016     STUDIES: No results found.  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 Low risk Oncotype DX and 15.  PLAN:    1. 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 Given her low risk Oncotype, she did not require adjuvant chemotherapy. Continune letrozole which she will take for 5 years completing in December 2022. Given the side effects listed in the history of present illness, patient was instructed to hold letrozole for one month and then call clinic to see if her symptoms resolve. If her symptoms improve, we will switch her to anastrozole. If her symptoms are unchanged, she will require additional primary care workup to assess an etiology. Return to clinic in 3 months for further evaluation.   2. Osteoporosis: Patient's bone mineral density on October 25, 2015 revealed a T score of -2.6 which is considered osteoporotic. Repeat bone marrow in August 2018. 3. Possible depression: We will hold letrozole as above.   Approximately 30 minutes was spent in discussion of which greater than 50% was consultation.  The entire visit was done in the presence of an interpreter.  Patient expressed understanding and was in agreement with this plan. She also understands that She can call clinic at any time with  any questions, concerns, or complaints.   Cancer Staging Primary cancer of upper outer quadrant of left female breast [facility]) Staging form: Breast, AJCC 7th Edition - Clinical stage from 11/16/2015: Stage IA (T1b, N0, M0) - Signed by Lloyd Huger, MD on 11/17/2015   Lloyd Huger, MD   06/04/2016 10:45 AM

## 2016-06-04 ENCOUNTER — Encounter (INDEPENDENT_AMBULATORY_CARE_PROVIDER_SITE_OTHER): Payer: Self-pay

## 2016-06-04 ENCOUNTER — Inpatient Hospital Stay: Payer: Medicare Other | Attending: Oncology | Admitting: Oncology

## 2016-06-04 VITALS — BP 135/85 | HR 67 | Temp 96.4°F | Resp 18 | Wt 160.5 lb

## 2016-06-04 DIAGNOSIS — I10 Essential (primary) hypertension: Secondary | ICD-10-CM | POA: Diagnosis not present

## 2016-06-04 DIAGNOSIS — M542 Cervicalgia: Secondary | ICD-10-CM | POA: Diagnosis not present

## 2016-06-04 DIAGNOSIS — G4700 Insomnia, unspecified: Secondary | ICD-10-CM | POA: Diagnosis not present

## 2016-06-04 DIAGNOSIS — M818 Other osteoporosis without current pathological fracture: Secondary | ICD-10-CM | POA: Diagnosis not present

## 2016-06-04 DIAGNOSIS — R51 Headache: Secondary | ICD-10-CM

## 2016-06-04 DIAGNOSIS — C50412 Malignant neoplasm of upper-outer quadrant of left female breast: Secondary | ICD-10-CM

## 2016-06-04 DIAGNOSIS — Z79899 Other long term (current) drug therapy: Secondary | ICD-10-CM | POA: Diagnosis not present

## 2016-06-04 DIAGNOSIS — K219 Gastro-esophageal reflux disease without esophagitis: Secondary | ICD-10-CM | POA: Diagnosis not present

## 2016-06-04 DIAGNOSIS — F329 Major depressive disorder, single episode, unspecified: Secondary | ICD-10-CM

## 2016-06-04 DIAGNOSIS — Z79811 Long term (current) use of aromatase inhibitors: Secondary | ICD-10-CM

## 2016-06-04 DIAGNOSIS — Z170 Estrogen receptor positive status [ER+]: Secondary | ICD-10-CM | POA: Diagnosis not present

## 2016-06-04 NOTE — Progress Notes (Signed)
Offers no complaints. States is feeling well. Had last breast exam performed by Dr. Baruch Gouty in January 2018. Interpreter was present.

## 2016-06-24 ENCOUNTER — Emergency Department: Payer: No Typology Code available for payment source

## 2016-06-24 ENCOUNTER — Emergency Department
Admission: EM | Admit: 2016-06-24 | Discharge: 2016-06-24 | Disposition: A | Discharge status: Home / Self Care | Payer: No Typology Code available for payment source | Attending: Emergency Medicine | Admitting: Emergency Medicine

## 2016-06-24 ENCOUNTER — Encounter: Payer: Self-pay | Admitting: Emergency Medicine

## 2016-06-24 DIAGNOSIS — Z79899 Other long term (current) drug therapy: Secondary | ICD-10-CM | POA: Insufficient documentation

## 2016-06-24 DIAGNOSIS — S199XXA Unspecified injury of neck, initial encounter: Secondary | ICD-10-CM | POA: Diagnosis not present

## 2016-06-24 DIAGNOSIS — C50412 Malignant neoplasm of upper-outer quadrant of left female breast: Secondary | ICD-10-CM | POA: Diagnosis not present

## 2016-06-24 DIAGNOSIS — R51 Headache: Secondary | ICD-10-CM | POA: Diagnosis not present

## 2016-06-24 DIAGNOSIS — Y999 Unspecified external cause status: Secondary | ICD-10-CM | POA: Insufficient documentation

## 2016-06-24 DIAGNOSIS — Y9389 Activity, other specified: Secondary | ICD-10-CM | POA: Diagnosis not present

## 2016-06-24 DIAGNOSIS — Z853 Personal history of malignant neoplasm of breast: Secondary | ICD-10-CM | POA: Diagnosis not present

## 2016-06-24 DIAGNOSIS — S299XXA Unspecified injury of thorax, initial encounter: Secondary | ICD-10-CM | POA: Diagnosis present

## 2016-06-24 DIAGNOSIS — S2242XA Multiple fractures of ribs, left side, initial encounter for closed fracture: Secondary | ICD-10-CM | POA: Insufficient documentation

## 2016-06-24 DIAGNOSIS — Y92410 Unspecified street and highway as the place of occurrence of the external cause: Secondary | ICD-10-CM | POA: Insufficient documentation

## 2016-06-24 DIAGNOSIS — I10 Essential (primary) hypertension: Secondary | ICD-10-CM | POA: Insufficient documentation

## 2016-06-24 HISTORY — DX: Malignant (primary) neoplasm, unspecified: C80.1

## 2016-06-24 LAB — BASIC METABOLIC PANEL
ANION GAP: 7 (ref 5–15)
BUN: <5 mg/dL — ABNORMAL LOW (ref 6–20)
CALCIUM: 9.6 mg/dL (ref 8.9–10.3)
CO2: 27 mmol/L (ref 22–32)
Chloride: 100 mmol/L — ABNORMAL LOW (ref 101–111)
Creatinine, Ser: 0.52 mg/dL (ref 0.44–1.00)
Glucose, Bld: 103 mg/dL — ABNORMAL HIGH (ref 65–99)
POTASSIUM: 4.5 mmol/L (ref 3.5–5.1)
Sodium: 134 mmol/L — ABNORMAL LOW (ref 135–145)

## 2016-06-24 LAB — TROPONIN I: Troponin I: <0.03 ng/mL (ref <0.03)

## 2016-06-24 MED ORDER — ACETAMINOPHEN 500 MG PO TABS
1000.0000 mg | ORAL_TABLET | Freq: Once | ORAL | Status: AC
  Administered 2016-06-24: 1000 mg via ORAL
  Filled 2016-06-24: qty 2

## 2016-06-24 MED ORDER — OXYCODONE HCL 5 MG PO TABS
5.0000 mg | ORAL_TABLET | Freq: Once | ORAL | Status: AC
  Administered 2016-06-24: 5 mg via ORAL
  Filled 2016-06-24: qty 1

## 2016-06-24 MED ORDER — OXYCODONE HCL 5 MG PO TABS: 5.0000 mg | ORAL_TABLET | Freq: Three times a day (TID) | ORAL | 0 refills | Status: AC | PRN

## 2016-06-24 MED ORDER — IOPAMIDOL (ISOVUE-300) INJECTION 61%
100.0000 mL | Freq: Once | INTRAVENOUS | Status: AC | PRN
  Administered 2016-06-24: 100 mL via INTRAVENOUS
  Filled 2016-06-24: qty 100

## 2016-06-24 MED ORDER — LIDOCAINE 5 % EX PTCH: 1.0000 | MEDICATED_PATCH | Freq: Two times a day (BID) | CUTANEOUS | 0 refills | Status: AC
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
## 2016-06-24 NOTE — Discharge Instructions (Signed)
Control del dolor: tome tylenol 1000 mg cada 8 horas. Tome 5 mg de oxicodona cada 6 horas para Conservation officer, historic buildings irruptivo. Si necesita la oxicodona, asegrese de tomar tambin un senokot para Engineer, civil (consulting).  No tome alcohol, conduzca ni participe en ninguna otra actividad potencialmente peligrosa mientras tome este medicamento, ya que puede causarle sueo. No tome este medicamento con otros medicamentos sedantes, ya sean recetados o sin receta.   Pain control: Take tylenol 1000mg  every 8 hours. Take 5mg  of oxycodone every 6 hours for breakthrough pain. If you need the oxycodone make sure to take one senokot as well to prevent constipation.  Do not drink alcohol, drive or participate in any other potentially dangerous activities while taking this medication as it may make you sleepy. Do not take this medication with any other sedating medications, either prescription or over-the-counter.

## 2016-08-27 ENCOUNTER — Inpatient Hospital Stay: Payer: Medicare Other | Attending: Oncology | Admitting: Oncology

## 2016-08-27 ENCOUNTER — Other Ambulatory Visit: Payer: Self-pay | Admitting: *Deleted

## 2016-08-27 ENCOUNTER — Encounter: Payer: Self-pay | Admitting: Radiation Oncology

## 2016-08-27 ENCOUNTER — Ambulatory Visit
Admission: RE | Admit: 2016-08-27 | Discharge: 2016-08-27 | Disposition: A | Discharge status: Home / Self Care | Payer: Medicare Other | Source: Ambulatory Visit | Attending: Radiation Oncology | Admitting: Radiation Oncology

## 2016-08-27 VITALS — BP 135/85 | HR 72 | Temp 96.7°F | Resp 18 | Wt 164.0 lb

## 2016-08-27 VITALS — BP 135/85 | HR 72 | Temp 96.7°F | Resp 189 | Wt 164.1 lb

## 2016-08-27 DIAGNOSIS — I10 Essential (primary) hypertension: Secondary | ICD-10-CM | POA: Diagnosis not present

## 2016-08-27 DIAGNOSIS — M810 Age-related osteoporosis without current pathological fracture: Secondary | ICD-10-CM

## 2016-08-27 DIAGNOSIS — M129 Arthropathy, unspecified: Secondary | ICD-10-CM | POA: Diagnosis not present

## 2016-08-27 DIAGNOSIS — Z87442 Personal history of urinary calculi: Secondary | ICD-10-CM | POA: Insufficient documentation

## 2016-08-27 DIAGNOSIS — Z923 Personal history of irradiation: Secondary | ICD-10-CM | POA: Diagnosis not present

## 2016-08-27 DIAGNOSIS — C50412 Malignant neoplasm of upper-outer quadrant of left female breast: Secondary | ICD-10-CM

## 2016-08-27 DIAGNOSIS — Z170 Estrogen receptor positive status [ER+]: Secondary | ICD-10-CM | POA: Diagnosis not present

## 2016-08-27 DIAGNOSIS — Z79811 Long term (current) use of aromatase inhibitors: Secondary | ICD-10-CM | POA: Insufficient documentation

## 2016-08-27 DIAGNOSIS — Z79899 Other long term (current) drug therapy: Secondary | ICD-10-CM | POA: Diagnosis not present

## 2016-08-27 DIAGNOSIS — F329 Major depressive disorder, single episode, unspecified: Secondary | ICD-10-CM | POA: Insufficient documentation

## 2016-08-27 DIAGNOSIS — K219 Gastro-esophageal reflux disease without esophagitis: Secondary | ICD-10-CM | POA: Diagnosis not present

## 2016-08-27 DIAGNOSIS — Z9012 Acquired absence of left breast and nipple: Secondary | ICD-10-CM | POA: Insufficient documentation

## 2016-08-27 MED ORDER — ANASTROZOLE 1 MG PO TABS: 1.0000 mg | ORAL_TABLET | Freq: Every day | ORAL | 5 refills | Status: DC

## 2016-08-27 NOTE — Progress Notes (Signed)
Radiation Oncology Follow up Note  Name: Jasmine Gardner   Date:   08/27/2016 MRN:  [id number] DOB: 21-Dec-1950    This 66 y.o. female presents to the clinic today for 6 month follow-up status post whole breast radiation to her left breast for stage I invasive mammary carcinoma.  REFERRING PROVIDER: [facility]  HPI: Patient is a 66 year old female now out 6 months having completed radiation therapy to her left breast for stage I (T1 N0 M0 invasive mammary carcinoma ER/PR positive HER-2/neu negative. She is seen today in routine follow-up and is doing well. Currently on Femara and tolerating that well without side effect. Has not had a mammogram order Geraldine Solar will be seeing medical oncology today and have it ordered at that time..  COMPLICATIONS OF TREATMENT: none  FOLLOW UP COMPLIANCE: keeps appointments   PHYSICAL EXAM:  BP 135/85   Pulse 72   Temp (!) 96.7 F (35.9 C)   Resp (!) 189   Wt 164 lb 2.1 oz (74.5 kg)   BMI 31.01 kg/m  Lungs are clear to A&P cardiac examination essentially unremarkable with regular rate and rhythm. No dominant mass or nodularity is noted in either breast in 2 positions examined. Incision is well-healed. No axillary or supraclavicular adenopathy is appreciated. Cosmetic result is excellent. Well-developed well-nourished patient in NAD. HEENT reveals PERLA, EOMI, discs not visualized.  Oral cavity is clear. No oral mucosal lesions are identified. Neck is clear without evidence of cervical or supraclavicular adenopathy. Lungs are clear to A&P. Cardiac examination is essentially unremarkable with regular rate and rhythm without murmur rub or thrill. Abdomen is benign with no organomegaly or masses noted. Motor sensory and DTR levels are equal and symmetric in the upper and lower extremities. Cranial nerves II through XII are grossly intact. Proprioception is intact. No peripheral adenopathy or edema is identified. No motor or sensory levels are noted.  Crude visual fields are within normal range.  RADIOLOGY RESUmammograms to be ordered  Plan at the present time patient is doing well with no evidence of disease. I am please were overall progress. She continues on Femara without side effect. I've asked to see medical oncology today that they order her follow-up mammograms. Otherwise I've asked to see the patient back in 6 months for follow-up. She knows to call sooner with any concerns.  I would like to take this opportunity to thank you for allowing me to participate in the care of your patient.Armstead Peaks., MD

## 2016-08-27 NOTE — Progress Notes (Signed)
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 Low risk Oncotype Dx of 15.  INTERVAL HISTORY: Patient returns to clinic today for routine 3 month evaluation. She had multiple side affects from letrozole, but never initiated anastrozole. She currently feels well and is asymptomatic. She has no neurologic complaints. She denies any recent fevers or illnesses. She has a good appetite and denies weight loss. She denies any chest pain or shortness of breath. She denies any nausea, vomiting, constipation, or diarrhea. She has no urinary complaints. Patient offers no specific complaints today.  REVIEW OF SYSTEMS:   Review of Systems  Constitutional: Negative.  Negative for fever, malaise/fatigue and weight loss.  Respiratory: Negative.  Negative for cough and shortness of breath.   Cardiovascular: Negative.  Negative for chest pain and leg swelling.  Gastrointestinal: Negative.  Negative for abdominal pain.  Genitourinary: Negative.   Musculoskeletal: Negative.  Negative for neck pain.  Skin: Negative.  Negative for rash.  Neurological: Negative.  Negative for weakness and headaches.  Psychiatric/Behavioral: Negative.  Negative for depression. The patient is not nervous/anxious.     As per HPI. Otherwise, a complete review of systems is negative.  PAST MEDICAL HISTORY: Past Medical History:  Diagnosis Date  . Arthritis   . Cancer ([place])    breast  . Depression   . GERD (gastroesophageal reflux disease)   . Headache   . Hypertension   . Post-menopausal   . UTI (urinary tract infection)     PAST SURGICAL HISTORY: Past Surgical History:  Procedure Laterality Date  . COLONOSCOPY  2007  .  PARTIAL MASTECTOMY WITH NEEDLE LOCALIZATION Left 11/25/2015   Procedure: PARTIAL MASTECTOMY WITH NEEDLE LOCALIZATION;  Surgeon: Leonie Green, MD;  Location: [facility];  Service: General;  Laterality: Left;  . SENTINEL NODE BIOPSY Left 11/25/2015   Procedure: SENTINEL NODE BIOPSY;  Surgeon: Leonie Green, MD;  Location: [facility] OR[facility];  Service: General;  Laterality: Left;  . TUBAL LIGATION      FAMILY HISTORY: Reviewed and unchanged. No reported history of malignancy or chronic disease.     ADVANCED DIRECTIVES (Y/N):  N   HEALTH MAINTENANCE: Social History  Substance Use Topics  . Smoking status: Never Smoker  . Smokeless tobacco: Never Used  . Alcohol use No     Colonoscopy:  PAP:  Bone density:  Lipid panel:  No Known Allergies  Current Outpatient Prescriptions  Medication Sig Dispense Refill  . amLODipine (NORVASC) 5 MG tablet Take 5 mg by mouth daily.    [name] anastrozole (ARIMIDEX) 1 MG tablet Take 1 tablet (1 mg total) by mouth daily. 30 tablet 5  . bisoprolol-hydrochlorothiazide (ZIAC) 10-6.25 MG tablet Take 1 tablet by mouth 2 (two) times daily.    [name] letrozole (FEMARA) 2.5 MG tablet Take 1 tablet (2.5 mg total) by mouth daily. (Patient not taking: Reported on 08/27/2016) 30 tablet 3  . lidocaine (LIDODERM) 5 % Place 1 patch onto the skin every 12 (twelve) hours. Remove & Discard patch within 12 hours or as directed by MD 10 patch 0  . lisinopril (PRINIVIL,ZESTRIL) 40 MG tablet Take 40 mg by mouth at bedtime.    [name] oxyCODONE (ROXICODONE) 5 MG immediate release tablet Take 1 tablet (5 mg  total) by mouth every 8 (eight) hours as needed. (Patient not taking: Reported on 08/27/2016) 20 tablet 0  . pravastatin (PRAVACHOL) 20 MG tablet Take 20 mg by mouth at bedtime.    . sertraline (ZOLOFT) 25 MG tablet Take 25 mg by mouth daily.     No current facility-administered medications for this visit.     OBJECTIVE: Vitals:   08/27/16 1118  BP: 135/85  Pulse: 72  Resp: 18  Temp:  (!) 96.7 F (35.9 C)     Body mass index is 30.99 kg/m.    ECOG FS:0 - Asymptomatic  General: Well-developed, well-nourished, no acute distress. Eyes: Pink conjunctiva, anicteric sclera. Breasts: Patient requested exam be deferred today. Lungs: Clear to auscultation bilaterally. Heart: Regular rate and rhythm. No rubs, murmurs, or gallops. Abdomen: Soft, nontender, nondistended. No organomegaly noted, normoactive bowel sounds. Musculoskeletal: No edema, cyanosis, or clubbing. Neuro: Alert, answering all questions appropriately. Cranial nerves grossly intact. Skin: No rashes or petechiae noted. Psych: Normal affect.   LAB RESULTS:  Lab Results  Component Value Date   NA 134 (L) 06/24/2016   K 4.5 06/24/2016   CL 100 (L) 06/24/2016   CO2 27 06/24/2016   GLUCOSE 103 (H) 06/24/2016   BUN <5 (L) 06/24/2016   CREATININE 0.52 06/24/2016   CALCIUM 9.6 06/24/2016   GFRNONAA >60 06/24/2016   GFRAA >60 06/24/2016    Lab Results  Component Value Date   WBC 5.5 02/14/2016   HGB 12.6 02/14/2016   HCT 37.5 02/14/2016   MCV 83.1 02/14/2016   PLT 198 02/14/2016     STUDIES: No results found.  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 Low risk Oncotype DX and 15.  PLAN:    1. 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 Given her low risk Oncotype, she did not require adjuvant chemotherapy. Patient could not tolerate letrozole, therefore was given a prescription for anastrozole today. She will take this for total 5 years completing in June 2023. Return to clinic in 3 months for further evaluation.   2. Osteoporosis: Patient's bone mineral density on October 25, 2015 revealed a T score of -2.6 which is considered osteoporotic. Repeat bone mineral density in August 2018. Continue  calcium and vitamin D. Consider Fosamax in the future. 3. Depression: Continue Zoloft as prescribed.  Approximately 30 minutes was spent in discussion of which greater than 50% was consultation.  The entire visit was done in the presence of an interpreter.  Patient expressed understanding and was in agreement with this plan. She also understands that She can call clinic at any time with any questions, concerns, or complaints.   Cancer Staging Primary cancer of upper outer quadrant of left female breast [facility]) Staging form: Breast, AJCC 7th Edition - Clinical stage from 11/16/2015: Stage IA (T1b, N0, M0) - Signed by Lloyd Huger, MD on 11/17/2015   Lloyd Huger, MD   09/02/2016 8:02 AM

## 2016-09-03 ENCOUNTER — Ambulatory Visit: Payer: Self-pay | Admitting: Oncology

## 2016-09-03 ENCOUNTER — Ambulatory Visit: Payer: Self-pay | Admitting: Radiation Oncology

## 2016-11-08 HISTORY — PX: BREAST BIOPSY: SHX20

## 2016-11-23 NOTE — Progress Notes (Deleted)
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 Low risk Oncotype Dx of 15.  INTERVAL HISTORY: Patient returns to clinic today for routine 3 month evaluation. She had multiple side affects from letrozole, but never initiated anastrozole. She currently feels well and is asymptomatic. She has no neurologic complaints. She denies any recent fevers or illnesses. She has a good appetite and denies weight loss. She denies any chest pain or shortness of breath. She denies any nausea, vomiting, constipation, or diarrhea. She has no urinary complaints. Patient offers no specific complaints today.  REVIEW OF SYSTEMS:   Review of Systems  Constitutional: Negative.  Negative for fever, malaise/fatigue and weight loss.  Respiratory: Negative.  Negative for cough and shortness of breath.   Cardiovascular: Negative.  Negative for chest pain and leg swelling.  Gastrointestinal: Negative.  Negative for abdominal pain.  Genitourinary: Negative.   Musculoskeletal: Negative.  Negative for neck pain.  Skin: Negative.  Negative for rash.  Neurological: Negative.  Negative for weakness and headaches.  Psychiatric/Behavioral: Negative.  Negative for depression. The patient is not nervous/anxious.     As per HPI. Otherwise, a complete review of systems is negative.  PAST MEDICAL HISTORY: Past Medical History:  Diagnosis Date  . Arthritis   . Cancer ([place])    breast  . Depression   . GERD (gastroesophageal reflux disease)   . Headache   . Hypertension   . Post-menopausal   . UTI (urinary tract infection)     PAST SURGICAL HISTORY: Past Surgical History:  Procedure Laterality Date  . COLONOSCOPY  2007  .  PARTIAL MASTECTOMY WITH NEEDLE LOCALIZATION Left 11/25/2015   Procedure: PARTIAL MASTECTOMY WITH NEEDLE LOCALIZATION;  Surgeon: Leonie Green, MD;  Location: [facility] ORS;  Service: General;  Laterality: Left;  . SENTINEL NODE BIOPSY Left 11/25/2015   Procedure: SENTINEL NODE BIOPSY;  Surgeon: Leonie Green, MD;  Location: [facility] OR[facility];  Service: General;  Laterality: Left;  . TUBAL LIGATION      FAMILY HISTORY: Reviewed and unchanged. No reported history of malignancy or chronic disease.     ADVANCED DIRECTIVES (Y/N):  N   HEALTH MAINTENANCE: Social History  Substance Use Topics  . Smoking status: Never Smoker  . Smokeless tobacco: Never Used  . Alcohol use No     Colonoscopy:  PAP:  Bone density:  Lipid panel:  No Known Allergies  Current Outpatient Prescriptions  Medication Sig Dispense Refill  . amLODipine (NORVASC) 5 MG tablet Take 5 mg by mouth daily.    [name] anastrozole (ARIMIDEX) 1 MG tablet Take 1 tablet (1 mg total) by mouth daily. 30 tablet 5  . bisoprolol-hydrochlorothiazide (ZIAC) 10-6.25 MG tablet Take 1 tablet by mouth 2 (two) times daily.    [name] letrozole (FEMARA) 2.5 MG tablet Take 1 tablet (2.5 mg total) by mouth daily. (Patient not taking: Reported on 08/27/2016) 30 tablet 3  . lidocaine (LIDODERM) 5 % Place 1 patch onto the skin every 12 (twelve) hours. Remove & Discard patch within 12 hours or as directed by MD 10 patch 0  . lisinopril (PRINIVIL,ZESTRIL) 40 MG tablet Take 40 mg by mouth at bedtime.    [name] oxyCODONE (ROXICODONE) 5 MG immediate release tablet Take 1 tablet (5 mg  total) by mouth every 8 (eight) hours as needed. (Patient not taking: Reported on 08/27/2016) 20 tablet 0  . pravastatin (PRAVACHOL) 20 MG tablet Take 20 mg by mouth at bedtime.    . sertraline (ZOLOFT) 25 MG tablet Take 25 mg by mouth daily.     No current facility-administered medications for this visit.     OBJECTIVE: There were no vitals filed for this visit.   There is no height or  weight on file to calculate BMI.    ECOG FS:0 - Asymptomatic  General: Well-developed, well-nourished, no acute distress. Eyes: Pink conjunctiva, anicteric sclera. Breasts: Patient requested exam be deferred today. Lungs: Clear to auscultation bilaterally. Heart: Regular rate and rhythm. No rubs, murmurs, or gallops. Abdomen: Soft, nontender, nondistended. No organomegaly noted, normoactive bowel sounds. Musculoskeletal: No edema, cyanosis, or clubbing. Neuro: Alert, answering all questions appropriately. Cranial nerves grossly intact. Skin: No rashes or petechiae noted. Psych: Normal affect.   LAB RESULTS:  Lab Results  Component Value Date   NA 134 (L) 06/24/2016   K 4.5 06/24/2016   CL 100 (L) 06/24/2016   CO2 27 06/24/2016   GLUCOSE 103 (H) 06/24/2016   BUN <5 (L) 06/24/2016   CREATININE 0.52 06/24/2016   CALCIUM 9.6 06/24/2016   GFRNONAA >60 06/24/2016   GFRAA >60 06/24/2016    Lab Results  Component Value Date   WBC 5.5 02/14/2016   HGB 12.6 02/14/2016   HCT 37.5 02/14/2016   MCV 83.1 02/14/2016   PLT 198 02/14/2016     STUDIES: No results found.  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 Low risk Oncotype DX and 15.  PLAN:    1. 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 Given her low risk Oncotype, she did not require adjuvant chemotherapy. Patient could not tolerate letrozole, therefore was given a prescription for anastrozole today. She will take this for total 5 years completing in June 2023. Return to clinic in 3 months for further evaluation.   2. Osteoporosis: Patient's bone mineral density on October 25, 2015 revealed a T score of -2.6 which is considered osteoporotic. Repeat bone mineral density in August 2018. Continue calcium and vitamin D.  Consider Fosamax in the future. 3. Depression: Continue Zoloft as prescribed.  Approximately 30 minutes was spent in discussion of which greater than 50% was consultation.  The entire visit was done in the presence of an interpreter.  Patient expressed understanding and was in agreement with this plan. She also understands that She can call clinic at any time with any questions, concerns, or complaints.   Cancer Staging Primary cancer of upper outer quadrant of left female breast [facility]) Staging form: Breast, AJCC 7th Edition - Clinical stage from 11/16/2015: Stage IA (T1b, N0, M0) - Signed by Lloyd Huger, MD on 11/17/2015   Lloyd Huger, MD   11/23/2016 1:13 PM

## 2016-11-24 HISTORY — PX: BREAST LUMPECTOMY: SHX2

## 2016-11-24 HISTORY — PX: BREAST EXCISIONAL BIOPSY: SUR124

## 2016-11-27 ENCOUNTER — Inpatient Hospital Stay: Payer: Medicare Other | Admitting: Oncology
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
## 2016-12-04 ENCOUNTER — Telehealth: Payer: Self-pay | Admitting: Urology

## 2016-12-04 ENCOUNTER — Encounter: Payer: Self-pay | Admitting: Urology

## 2016-12-04 ENCOUNTER — Ambulatory Visit (INDEPENDENT_AMBULATORY_CARE_PROVIDER_SITE_OTHER): Payer: Medicare Other | Admitting: Urology

## 2016-12-04 VITALS — BP 146/83 | HR 69 | Ht 61.0 in | Wt 163.5 lb

## 2016-12-04 DIAGNOSIS — R1032 Left lower quadrant pain: Secondary | ICD-10-CM

## 2016-12-04 DIAGNOSIS — N8111 Cystocele, midline: Secondary | ICD-10-CM | POA: Diagnosis not present

## 2016-12-04 DIAGNOSIS — R350 Frequency of micturition: Secondary | ICD-10-CM | POA: Diagnosis not present

## 2016-12-04 DIAGNOSIS — N952 Postmenopausal atrophic vaginitis: Secondary | ICD-10-CM | POA: Diagnosis not present

## 2016-12-04 LAB — URINALYSIS, COMPLETE
Bilirubin, UA: NEGATIVE
GLUCOSE, UA: NEGATIVE
KETONES UA: NEGATIVE
Nitrite, UA: NEGATIVE
Protein, UA: NEGATIVE
RBC, UA: NEGATIVE
Specific Gravity, UA: 1.01 (ref 1.005–1.030)
Urobilinogen, Ur: 0.2 mg/dL (ref 0.2–1.0)
pH, UA: 7 (ref 5.0–7.5)

## 2016-12-04 LAB — BLADDER SCAN AMB NON-IMAGING: Scan Result: 58

## 2016-12-04 NOTE — Telephone Encounter (Signed)
Please send my note to Dr. Bender.   

## 2016-12-05 ENCOUNTER — Other Ambulatory Visit: Payer: Self-pay | Admitting: Primary Care

## 2016-12-05 DIAGNOSIS — Z1231 Encounter for screening mammogram for malignant neoplasm of breast: Secondary | ICD-10-CM

## 2016-12-06 NOTE — Telephone Encounter (Signed)
Notes have been faxed  Sharyn Lull

## 2016-12-11 NOTE — Progress Notes (Signed)
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 Low risk Oncotype Dx of 15.  INTERVAL HISTORY: Patient returns to clinic today for routine 3 month evaluation. She is now tolerating anastrozole well without significant side effects. She is complaining of erythema and tenderness of her left breast that she states has been there since completing XRT, but did not mention at her appointment 3 months ago. She otherwise feels well and is asymptomatic. She has no neurologic complaints. She denies any recent fevers or illnesses. She has a good appetite and denies weight loss. She denies any chest pain or shortness of breath. She denies any nausea, vomiting, constipation, or diarrhea. She has no urinary complaints. Patient offers no specific complaints today.  REVIEW OF SYSTEMS:   Review of Systems  Constitutional: Negative.  Negative for fever, malaise/fatigue and weight loss.  Respiratory: Negative.  Negative for cough and shortness of breath.   Cardiovascular: Negative.  Negative for chest pain and leg swelling.  Gastrointestinal: Negative.  Negative for abdominal pain.  Genitourinary: Negative.   Musculoskeletal: Negative.  Negative for neck pain.  Skin: Negative.  Negative for rash.  Neurological: Negative.  Negative for weakness and headaches.  Psychiatric/Behavioral: Negative.  Negative for depression. The patient is not nervous/anxious.     As per HPI. Otherwise, a complete review of systems is negative.  PAST MEDICAL HISTORY: Past Medical History:  Diagnosis Date  . Arthritis   . Cancer (Jasmine Gardner)    breast  . Depression   . GERD (gastroesophageal reflux disease)   . Headache   . Hypertension     . Post-menopausal   . UTI (urinary tract infection)     PAST SURGICAL HISTORY: Past Surgical History:  Procedure Laterality Date  . COLONOSCOPY  2007  . PARTIAL MASTECTOMY WITH NEEDLE LOCALIZATION Left 11/25/2015   Procedure: PARTIAL MASTECTOMY WITH NEEDLE LOCALIZATION;  Surgeon: Leonie Green, MD;  Location: [facility] ORS;  Service: General;  Laterality: Left;  . SENTINEL NODE BIOPSY Left 11/25/2015   Procedure: SENTINEL NODE BIOPSY;  Surgeon: Leonie Green, MD;  Location: [facility] ORS;  Service: General;  Laterality: Left;  . TUBAL LIGATION      FAMILY HISTORY: Reviewed and unchanged. No reported history of malignancy or chronic disease.     ADVANCED DIRECTIVES (Y/N):  N   HEALTH MAINTENANCE: Social History  Substance Use Topics  . Smoking status: Never Smoker  . Smokeless tobacco: Never Used  . Alcohol use No     Colonoscopy:  PAP:  Bone density:  Lipid panel:  No Known Allergies  Current Outpatient Prescriptions  Medication Sig Dispense Refill  . amLODipine (NORVASC) 5 MG tablet Take 5 mg by mouth daily.    [name] anastrozole (ARIMIDEX) 1 MG tablet Take 1 tablet (1 mg total) by mouth daily. 30 tablet 5  . bisoprolol-hydrochlorothiazide (ZIAC) 10-6.25 MG tablet Take 1 tablet by mouth 2 (two) times daily.    [name] lisinopril (PRINIVIL,ZESTRIL) 40 MG tablet Take 40 mg by mouth at bedtime.    . ciprofloxacin (CIPRO) 500 MG tablet Take 500 mg by mouth 2 (two) times daily.    [name] doxycycline (VIBRAMYCIN) 100 MG capsule Take 1 capsule (100 mg total) by mouth 2 (two) times daily. 20 capsule 0  .  lidocaine (LIDODERM) 5 % Place 1 patch onto the skin every 12 (twelve) hours. Remove & Discard patch within 12 hours or as directed by MD (Patient not taking: Reported on 12/04/2016) 10 patch 0  . oxyCODONE (ROXICODONE) 5 MG immediate release tablet Take 1 tablet (5 mg total) by mouth every 8 (eight) hours as needed. (Patient not taking: Reported on 08/27/2016) 20 tablet 0  . pravastatin  (PRAVACHOL) 20 MG tablet Take 20 mg by mouth at bedtime.    . sertraline (ZOLOFT) 25 MG tablet Take 25 mg by mouth daily.     No current facility-administered medications for this visit.     OBJECTIVE: Vitals:   12/13/16 1558  BP: 117/78  Pulse: 71  Resp: 20  Temp: (!) 96.9 F (36.1 C)     Body mass index is 30.78 kg/m.    ECOG FS:0 - Asymptomatic  General: Well-developed, well-nourished, no acute distress. Eyes: Pink conjunctiva, anicteric sclera. Breasts: Left breast with mild erythema and tenderness, no palpable masses.  Lungs: Clear to auscultation bilaterally. Heart: Regular rate and rhythm. No rubs, murmurs, or gallops. Abdomen: Soft, nontender, nondistended. No organomegaly noted, normoactive bowel sounds. Musculoskeletal: No edema, cyanosis, or clubbing. Neuro: Alert, answering all questions appropriately. Cranial nerves grossly intact. Skin: No rashes or petechiae noted. Psych: Normal affect.   LAB RESULTS:  Lab Results  Component Value Date   NA 134 (L) 06/24/2016   K 4.5 06/24/2016   CL 100 (L) 06/24/2016   CO2 27 06/24/2016   GLUCOSE 103 (H) 06/24/2016   BUN <5 (L) 06/24/2016   CREATININE 0.52 06/24/2016   CALCIUM 9.6 06/24/2016   GFRNONAA >60 06/24/2016   GFRAA >60 06/24/2016    Lab Results  Component Value Date   WBC 5.5 02/14/2016   HGB 12.6 02/14/2016   HCT 37.5 02/14/2016   MCV 83.1 02/14/2016   PLT 198 02/14/2016     STUDIES: No results found.  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 Low risk Oncotype DX and 15.  PLAN:    1. 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 Given her low risk Oncotype, she did not require adjuvant chemotherapy. Patient could not tolerate letrozole, therefore was switched to anastrozole. Patient is due  for her mammogram next week. She will take this for total 5 years completing in June 2023. Return to clinic in 3 months for further evaluation.   2. Osteoporosis: Patient's bone mineral density on October 25, 2015 revealed a T score of -2.6 which is considered osteoporotic. She will require repeat bone marrow density in the near future. Continue calcium and vitamin D. Consider Fosamax in the future. 3. Depression: Continue Zoloft as prescribed. 4. Breast erythema: Patient was given a prescription for doxycycline today. Repeat mammogram as above. She was also given a referral back to surgery for further evaluation.  Approximately 30 minutes was spent in discussion of which greater than 50% was consultation.  The entire visit was done in the presence of an interpreter.  Patient expressed understanding and was in agreement with this plan. She also understands that She can call clinic at any time with any questions, concerns, or complaints.   Cancer Staging Primary cancer of upper outer quadrant of left female breast [facility]) Staging form: Breast, AJCC 7th Edition - Clinical stage from 11/16/2015: Stage IA (T1b,  N0, M0) - Signed by Lloyd Huger, MD on 11/17/2015   Lloyd Huger, MD   12/19/2016 10:26 AM

## 2016-12-13 ENCOUNTER — Inpatient Hospital Stay: Payer: Medicare Other | Attending: Oncology | Admitting: Oncology

## 2016-12-13 VITALS — BP 117/78 | HR 71 | Temp 96.9°F | Resp 20 | Wt 162.9 lb

## 2016-12-13 DIAGNOSIS — I10 Essential (primary) hypertension: Secondary | ICD-10-CM | POA: Diagnosis not present

## 2016-12-13 DIAGNOSIS — C50412 Malignant neoplasm of upper-outer quadrant of left female breast: Secondary | ICD-10-CM | POA: Diagnosis present

## 2016-12-13 DIAGNOSIS — Z79811 Long term (current) use of aromatase inhibitors: Secondary | ICD-10-CM | POA: Insufficient documentation

## 2016-12-13 DIAGNOSIS — Z9012 Acquired absence of left breast and nipple: Secondary | ICD-10-CM | POA: Diagnosis not present

## 2016-12-13 DIAGNOSIS — M129 Arthropathy, unspecified: Secondary | ICD-10-CM

## 2016-12-13 DIAGNOSIS — F329 Major depressive disorder, single episode, unspecified: Secondary | ICD-10-CM | POA: Diagnosis not present

## 2016-12-13 DIAGNOSIS — L538 Other specified erythematous conditions: Secondary | ICD-10-CM | POA: Insufficient documentation

## 2016-12-13 DIAGNOSIS — Z87442 Personal history of urinary calculi: Secondary | ICD-10-CM | POA: Insufficient documentation

## 2016-12-13 DIAGNOSIS — Z170 Estrogen receptor positive status [ER+]: Secondary | ICD-10-CM | POA: Insufficient documentation

## 2016-12-13 DIAGNOSIS — Z79899 Other long term (current) drug therapy: Secondary | ICD-10-CM | POA: Diagnosis not present

## 2016-12-13 DIAGNOSIS — M810 Age-related osteoporosis without current pathological fracture: Secondary | ICD-10-CM

## 2016-12-13 DIAGNOSIS — Z923 Personal history of irradiation: Secondary | ICD-10-CM | POA: Insufficient documentation

## 2016-12-13 DIAGNOSIS — K219 Gastro-esophageal reflux disease without esophagitis: Secondary | ICD-10-CM | POA: Diagnosis not present

## 2016-12-13 MED ORDER — DOXYCYCLINE HYCLATE 100 MG PO CAPS: 100.0000 mg | ORAL_CAPSULE | Freq: Two times a day (BID) | ORAL | 0 refills | Status: DC

## 2016-12-13 NOTE — Progress Notes (Signed)
Patient reports left breast pain and redness.

## 2016-12-24 ENCOUNTER — Ambulatory Visit
Admission: RE | Admit: 2016-12-24 | Discharge: 2016-12-24 | Disposition: A | Discharge status: Home / Self Care | Payer: Medicare Other | Source: Ambulatory Visit | Attending: Oncology | Admitting: Oncology

## 2016-12-24 DIAGNOSIS — C50412 Malignant neoplasm of upper-outer quadrant of left female breast: Secondary | ICD-10-CM | POA: Insufficient documentation

## 2017-03-09 NOTE — Progress Notes (Signed)
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 Low risk Oncotype Dx of 15.  INTERVAL HISTORY: Patient returns to clinic today for routine 3 month evaluation. She is now tolerating anastrozole well without significant side effects. She currently feels well and is asymptomatic. She has no neurologic complaints. She denies any recent fevers or illnesses. She has a good appetite and denies weight loss. She denies any chest pain or shortness of breath. She denies any nausea, vomiting, constipation, or diarrhea. She has no urinary complaints. Patient offers no specific complaints today.  REVIEW OF SYSTEMS:   Review of Systems  Constitutional: Negative.  Negative for fever, malaise/fatigue and weight loss.  Respiratory: Negative.  Negative for cough and shortness of breath.   Cardiovascular: Negative.  Negative for chest pain and leg swelling.  Gastrointestinal: Negative.  Negative for abdominal pain.  Genitourinary: Negative.   Musculoskeletal: Negative.  Negative for neck pain.  Skin: Negative.  Negative for rash.  Neurological: Negative.  Negative for weakness and headaches.  Psychiatric/Behavioral: Negative.  Negative for depression. The patient is not nervous/anxious.     As per HPI. Otherwise, a complete review of systems is negative.  PAST MEDICAL HISTORY: Past Medical History:  Diagnosis Date  . Arthritis   . Cancer ([place])    breast  . Depression   . GERD (gastroesophageal reflux disease)   . Headache   . Hypertension   . Post-menopausal   . UTI (urinary tract infection)     PAST SURGICAL HISTORY: Past Surgical History:  Procedure Laterality Date  . BREAST BIOPSY Left 11/08/2016   bx +  . BREAST EXCISIONAL BIOPSY Left 11/24/2016   lumpectomy with rad  . COLONOSCOPY  2007  . PARTIAL MASTECTOMY WITH NEEDLE LOCALIZATION Left 11/25/2015   Procedure: PARTIAL MASTECTOMY WITH NEEDLE LOCALIZATION;  Surgeon: Leonie Green, MD;  Location: [facility] ORS;  Service: General;  Laterality: Left;  . SENTINEL NODE BIOPSY Left 11/25/2015   Procedure: SENTINEL NODE BIOPSY;  Surgeon: Leonie Green, MD;  Location: [facility] ORS;  Service: General;  Laterality: Left;  . TUBAL LIGATION      FAMILY HISTORY: Reviewed and unchanged. No reported history of malignancy or chronic disease.     ADVANCED DIRECTIVES (Y/N):  N   HEALTH MAINTENANCE: Social History   Tobacco Use  . Smoking status: Never Smoker  . Smokeless tobacco: Never Used  Substance Use Topics  . Alcohol use: No  . Drug use: No     Colonoscopy:  PAP:  Bone density:  Lipid panel:  No Known Allergies  Current Outpatient Medications  Medication Sig Dispense Refill  . amLODipine (NORVASC) 5 MG tablet Take 5 mg by mouth daily.    [name] anastrozole (ARIMIDEX) 1 MG tablet Take 1 tablet (1 mg total) by mouth daily. 30 tablet 5  . bisoprolol-hydrochlorothiazide (ZIAC) 10-6.25 MG tablet Take 1 tablet by mouth 2 (two) times daily.    . Calcium Carb-Cholecalciferol 500-400 MG-UNIT TABS Take by mouth.    . lidocaine (LIDODERM) 5 % Place 1 patch onto the skin every 12 (twelve) hours. Remove & Discard patch within 12 hours or as directed by MD 10 patch 0  . lisinopril (PRINIVIL,ZESTRIL) 40 MG tablet Take 40 mg by mouth at bedtime.    [name]  oxyCODONE (ROXICODONE) 5 MG immediate release tablet Take 1 tablet (5 mg total) by mouth every 8 (eight) hours as needed. 20 tablet 0  . pravastatin (PRAVACHOL) 20 MG tablet Take 20 mg by mouth at bedtime.    . sertraline (ZOLOFT) 25 MG tablet Take 25 mg by mouth daily.     No current facility-administered medications for this visit.     OBJECTIVE: There were no vitals filed for this visit.    There is no height or weight on file to calculate BMI.    ECOG FS:0 - Asymptomatic  General: Well-developed, well-nourished, no acute distress. Eyes: Pink conjunctiva, anicteric sclera. Breasts: Left with no palpable masses.  Lungs: Clear to auscultation bilaterally. Heart: Regular rate and rhythm. No rubs, murmurs, or gallops. Abdomen: Soft, nontender, nondistended. No organomegaly noted, normoactive bowel sounds. Musculoskeletal: No edema, cyanosis, or clubbing. Neuro: Alert, answering all questions appropriately. Cranial nerves grossly intact. Skin: No rashes or petechiae noted. Psych: Normal affect.   LAB RESULTS:  Lab Results  Component Value Date   NA 134 (L) 06/24/2016   K 4.5 06/24/2016   CL 100 (L) 06/24/2016   CO2 27 06/24/2016   GLUCOSE 103 (H) 06/24/2016   BUN <5 (L) 06/24/2016   CREATININE 0.52 06/24/2016   CALCIUM 9.6 06/24/2016   GFRNONAA >60 06/24/2016   GFRAA >60 06/24/2016    Lab Results  Component Value Date   WBC 5.5 02/14/2016   HGB 12.6 02/14/2016   HCT 37.5 02/14/2016   MCV 83.1 02/14/2016   PLT 198 02/14/2016     STUDIES: No results found.  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 Low risk Oncotype DX and 15.  PLAN:    1. 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 Given her low risk Oncotype, she did not require adjuvant chemotherapy. Patient could not tolerate letrozole, therefore was switched to anastrozole. She will take this for total 5 years completing in June 2023. Her most recent mammogram on December 24, 2016 was reported as BI-RADS 2.  Repeat in October 2019. Return to clinic in 6 months for further evaluation.   2. Osteoporosis: Patient's bone mineral density on October 25, 2015 revealed a T score of -2.6 which is considered  osteoporotic.  Repeat bone mineral density in the next 1-2 weeks.  She has been instructed to take calcium and vitamin D supplementation. Consider Fosamax in the future. 3. Depression: Continue Zoloft as prescribed.  Approximately 20 minutes was spent in discussion of which greater than 50% was consultation.  The entire visit was done in the presence of an interpreter.  Patient expressed understanding and was in agreement with this plan. She also understands that She can call clinic at any time with any questions, concerns, or complaints.   Cancer Staging Primary cancer of upper outer quadrant of left female breast [facility]) Staging form: Breast, AJCC 7th Edition - Clinical stage from 11/16/2015: Stage IA (T1b, N0, M0) - Signed by Lloyd Huger, MD on 11/17/2015   Lloyd Huger, MD   03/12/2017 10:52 AM

## 2017-03-11 ENCOUNTER — Encounter: Payer: Self-pay | Admitting: Radiation Oncology

## 2017-03-11 ENCOUNTER — Other Ambulatory Visit: Payer: Self-pay

## 2017-03-11 ENCOUNTER — Encounter: Payer: Self-pay | Admitting: *Deleted

## 2017-03-11 ENCOUNTER — Inpatient Hospital Stay: Payer: Medicare Other | Attending: Oncology | Admitting: Oncology

## 2017-03-11 ENCOUNTER — Ambulatory Visit
Admission: RE | Admit: 2017-03-11 | Discharge: 2017-03-11 | Disposition: A | Discharge status: Home / Self Care | Payer: Medicare Other | Source: Ambulatory Visit | Attending: Radiation Oncology | Admitting: Radiation Oncology

## 2017-03-11 VITALS — BP 137/84 | HR 70 | Temp 98.1°F | Resp 18 | Wt 160.5 lb

## 2017-03-11 DIAGNOSIS — C50412 Malignant neoplasm of upper-outer quadrant of left female breast: Secondary | ICD-10-CM

## 2017-03-11 DIAGNOSIS — Z170 Estrogen receptor positive status [ER+]: Secondary | ICD-10-CM | POA: Diagnosis not present

## 2017-03-11 DIAGNOSIS — I10 Essential (primary) hypertension: Secondary | ICD-10-CM | POA: Diagnosis not present

## 2017-03-11 DIAGNOSIS — Z780 Asymptomatic menopausal state: Secondary | ICD-10-CM

## 2017-03-11 DIAGNOSIS — Z923 Personal history of irradiation: Secondary | ICD-10-CM | POA: Insufficient documentation

## 2017-03-11 DIAGNOSIS — M810 Age-related osteoporosis without current pathological fracture: Secondary | ICD-10-CM | POA: Insufficient documentation

## 2017-03-11 DIAGNOSIS — F329 Major depressive disorder, single episode, unspecified: Secondary | ICD-10-CM

## 2017-03-11 DIAGNOSIS — K219 Gastro-esophageal reflux disease without esophagitis: Secondary | ICD-10-CM | POA: Diagnosis not present

## 2017-03-11 DIAGNOSIS — Z79811 Long term (current) use of aromatase inhibitors: Secondary | ICD-10-CM | POA: Diagnosis not present

## 2017-03-11 DIAGNOSIS — Z79899 Other long term (current) drug therapy: Secondary | ICD-10-CM | POA: Diagnosis not present

## 2017-03-11 NOTE — Progress Notes (Signed)
Radiation Oncology Follow up Note  Name: Jasmine Gardner   Date:   03/11/2017 MRN:  [id number] DOB: 1951/02/12    This 66 y.o. female presents to the clinic today for 1 year follow-up status post whole breast radiation to her left breast for stage I invasive mammary carcinoma.  REFERRING PROVIDER: Freddy Finner, NP  HPI: Patient is a 66 year old female now seen out 1 year having completed radiation therapy for stage I invasive mammary carcinoma ER/PR positive HER-2/neu negative. Seen today in routine follow-up she is doing well. She specifically denies breast tenderness cough or bone pain.. She had mammograms back in October which I have reviewed were BI-RADS 2 benign. She's currently on arimadex tolerating that well without side effect.  COMPLICATIONS OF TREATMENT: none  FOLLOW UP COMPLIANCE: keeps appointments   PHYSICAL EXAM:  BP 137/84   Pulse 70   Temp 98.1 F (36.7 C)   Resp 18   Wt 160 lb 7.9 oz (72.8 kg)   BMI 30.33 kg/m  Lungs are clear to A&P cardiac examination essentially unremarkable with regular rate and rhythm. No dominant mass or nodularity is noted in either breast in 2 positions examined. Incision is well-healed. No axillary or supraclavicular adenopathy is appreciated. Cosmetic result is excellent. Well-developed well-nourished patient in NAD. HEENT reveals PERLA, EOMI, discs not visualized.  Oral cavity is clear. No oral mucosal lesions are identified. Neck is clear without evidence of cervical or supraclavicular adenopathy. Lungs are clear to A&P. Cardiac examination is essentially unremarkable with regular rate and rhythm without murmur rub or thrill. Abdomen is benign with no organomegaly or masses noted. Motor sensory and DTR levels are equal and symmetric in the upper and lower extremities. Cranial nerves II through XII are grossly intact. Proprioception is intact. No peripheral adenopathy or edema is identified. No motor or sensory levels are noted. Crude  visual fields are within normal range.  RADIOLOGY RESULTS: Mammograms are reviewed and compatible above-stated findings  PLAN: Present time she continues to do well with no evidence of disease. I'm please were overall progress. She continues on arimadex without side effect. She is a rescheduled for follow-up mammograms next year. Patient knows to call with any concerns. I will see her back in 1 year for follow-up.  I would like to take this opportunity to thank you for allowing me to participate in the care of your patient.Noreene Filbert, MD

## 2017-03-11 NOTE — Progress Notes (Signed)
Patient was seen in Radiation this morning.

## 2017-03-14 ENCOUNTER — Other Ambulatory Visit: Payer: Self-pay

## 2017-03-14 ENCOUNTER — Ambulatory Visit: Payer: Self-pay | Admitting: Oncology

## 2017-03-14 MED ORDER — ANASTROZOLE 1 MG PO TABS: 1.0000 mg | ORAL_TABLET | Freq: Every day | ORAL | 5 refills | Status: DC

## 2017-04-17 ENCOUNTER — Ambulatory Visit
Admission: RE | Admit: 2017-04-17 | Discharge: 2017-04-17 | Disposition: A | Discharge status: Home / Self Care | Payer: Medicare Other | Source: Ambulatory Visit | Attending: Oncology | Admitting: Oncology

## 2017-04-17 DIAGNOSIS — M810 Age-related osteoporosis without current pathological fracture: Secondary | ICD-10-CM | POA: Insufficient documentation

## 2017-04-19 ENCOUNTER — Encounter: Payer: Self-pay | Admitting: *Deleted

## 2017-07-25 DIAGNOSIS — K219 Gastro-esophageal reflux disease without esophagitis: Secondary | ICD-10-CM | POA: Insufficient documentation

## 2017-07-25 DIAGNOSIS — F32A Depression, unspecified: Secondary | ICD-10-CM | POA: Insufficient documentation

## 2017-07-25 DIAGNOSIS — Z550 Illiteracy and low-level literacy: Secondary | ICD-10-CM | POA: Insufficient documentation

## 2017-07-25 DIAGNOSIS — I10 Essential (primary) hypertension: Secondary | ICD-10-CM | POA: Insufficient documentation

## 2017-07-25 DIAGNOSIS — E785 Hyperlipidemia, unspecified: Secondary | ICD-10-CM | POA: Insufficient documentation

## 2017-07-25 DIAGNOSIS — N952 Postmenopausal atrophic vaginitis: Secondary | ICD-10-CM | POA: Insufficient documentation

## 2017-07-25 DIAGNOSIS — F329 Major depressive disorder, single episode, unspecified: Secondary | ICD-10-CM | POA: Insufficient documentation

## 2017-09-08 NOTE — Progress Notes (Signed)
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 Low risk Oncotype Dx of 15.  INTERVAL HISTORY: Patient returns to clinic today for routine six-month follow-up.  She continues to tolerate anastrozole well without significant side effects, although admits to generalized bony pain particularly at night.  She currently feels well and is asymptomatic. She has no neurologic complaints. She denies any recent fevers or illnesses. She has a good appetite and denies weight loss. She denies any chest pain or shortness of breath. She denies any nausea, vomiting, constipation, or diarrhea. She has no urinary complaints.  Patient feels that her baseline offers no specific complaints today.  REVIEW OF SYSTEMS:   Review of Systems  Constitutional: Negative.  Negative for fever, malaise/fatigue and weight loss.  Respiratory: Negative.  Negative for cough and shortness of breath.   Cardiovascular: Negative.  Negative for chest pain and leg swelling.  Gastrointestinal: Negative.  Negative for abdominal pain.  Genitourinary: Negative.  Negative for dysuria.  Musculoskeletal: Positive for myalgias. Negative for neck pain.  Skin: Negative.  Negative for rash.  Neurological: Negative.  Negative for sensory change, focal weakness, weakness and headaches.  Psychiatric/Behavioral: Negative.  Negative for depression. The patient is not nervous/anxious.     As per HPI. Otherwise, a complete review of systems is negative.  PAST MEDICAL HISTORY: Past Medical History:  Diagnosis Date  . Arthritis   . Cancer ([place])    breast  . Depression   . GERD (gastroesophageal reflux disease)   . Headache   . Hypertension   .  Post-menopausal   . UTI (urinary tract infection)     PAST SURGICAL HISTORY: Past Surgical History:  Procedure Laterality Date  . BREAST BIOPSY Left 11/08/2016   bx +  . BREAST EXCISIONAL BIOPSY Left 11/24/2016   lumpectomy with rad  . COLONOSCOPY  2007  . PARTIAL MASTECTOMY WITH NEEDLE LOCALIZATION Left 11/25/2015   Procedure: PARTIAL MASTECTOMY WITH NEEDLE LOCALIZATION;  Surgeon: Leonie Green, MD;  Location: [facility] ORS;  Service: General;  Laterality: Left;  . SENTINEL NODE BIOPSY Left 11/25/2015   Procedure: SENTINEL NODE BIOPSY;  Surgeon: Leonie Green, MD;  Location: [facility] ORS;  Service: General;  Laterality: Left;  . TUBAL LIGATION      FAMILY HISTORY: Reviewed and unchanged. No reported history of malignancy or chronic disease.     ADVANCED DIRECTIVES (Y/N):  N   HEALTH MAINTENANCE: Social History   Tobacco Use  . Smoking status: Never Smoker  . Smokeless tobacco: Never Used  Substance Use Topics  . Alcohol use: No  . Drug use: No     Colonoscopy:  PAP:  Bone density:  Lipid panel:  No Known Allergies  Current Outpatient Medications  Medication Sig Dispense Refill  . amLODipine (NORVASC) 5 MG tablet Take 5 mg by mouth daily.    [name] anastrozole (ARIMIDEX) 1 MG tablet Take 1 tablet (1 mg total) by mouth daily. 30 tablet 5  . bisoprolol-hydrochlorothiazide (ZIAC) 10-6.25 MG tablet Take 1 tablet by mouth 2 (two) times daily.    . Calcium Carb-Cholecalciferol 500-400 MG-UNIT TABS Take by mouth.    . estradiol (ESTRACE VAGINAL) 0.1 MG/GM vaginal cream Place vaginally.    [name] lisinopril (PRINIVIL,ZESTRIL) 40 MG tablet Take  40 mg by mouth at bedtime.    [name] omeprazole (PRILOSEC) 20 MG capsule Take 20 mg by mouth daily.     . pravastatin (PRAVACHOL) 20 MG tablet Take 20 mg by mouth at bedtime.    . sertraline (ZOLOFT) 25 MG tablet Take 25 mg by mouth daily.    [name] alendronate (FOSAMAX) 70 MG tablet Take 1 tablet (70 mg total) by mouth once a week. Take with a full  glass of water on an empty stomach. 12 tablet 3   No current facility-administered medications for this visit.     OBJECTIVE: Vitals:   09/09/17 1143  BP: 134/83  Pulse: 65  Resp: 18  Temp: (!) 97.1 F (36.2 C)     Body mass index is 30.55 kg/m.    ECOG FS:0 - Asymptomatic  General: Well-developed, well-nourished, no acute distress. Eyes: Pink conjunctiva, anicteric sclera. Breast: Patient requested exam be deferred today. Lungs: Clear to auscultation bilaterally. Heart: Regular rate and rhythm. No rubs, murmurs, or gallops. Abdomen: Soft, nontender, nondistended. No organomegaly noted, normoactive bowel sounds. Musculoskeletal: No edema, cyanosis, or clubbing. Neuro: Alert, answering all questions appropriately. Cranial nerves grossly intact. Skin: No rashes or petechiae noted. Psych: Normal affect.  LAB RESULTS:  Lab Results  Component Value Date   NA 134 (L) 06/24/2016   K 4.5 06/24/2016   CL 100 (L) 06/24/2016   CO2 27 06/24/2016   GLUCOSE 103 (H) 06/24/2016   BUN <5 (L) 06/24/2016   CREATININE 0.52 06/24/2016   CALCIUM 9.6 06/24/2016   GFRNONAA >60 06/24/2016   GFRAA >60 06/24/2016    Lab Results  Component Value Date   WBC 5.5 02/14/2016   HGB 12.6 02/14/2016   HCT 37.5 02/14/2016   MCV 83.1 02/14/2016   PLT 198 02/14/2016     STUDIES: No results found.  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 Low risk Oncotype DX and 15.  PLAN:    1. 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  She also completed adjuvant XRT.  Given her low risk Oncotype, she did not require adjuvant chemotherapy. Patient could not tolerate letrozole, therefore was switched to anastrozole. She will take this for total 5 years completing in June 2023. Her most recent mammogram  on December 24, 2016 was reported as BI-RADS 2.  Repeat in October 2019.  Return to clinic in 6 months for routine evaluation. 2. Osteoporosis: Patient's most recent bone mineral density on April 17, 2017 reported T score of -3.3 which is significantly worse when August 2017 when her T score was reported -2.6.  Continue Fosamax, calcium, and vitamin D.  Repeat in January 2020.  Patient's bone mineral density on October 25, 2015 revealed a T score of -2.6 which is considered osteoporotic.  Repeat bone mineral density in the next 1-2 weeks.  She has been instructed to take calcium and vitamin D supplementation. Consider Fosamax in the future. 3. Depression: Continue Zoloft as prescribed.  I spent a total of 20 minutes face-to-face with the patient of which greater than 50% of the visit was spent in counseling and coordination of care as summarized above.  The entire visit was done in the presence of an interpreter.  Patient expressed understanding and was in agreement with this plan. She also understands that She can call clinic at any time  with any questions, concerns, or complaints.   Cancer Staging Primary cancer of upper outer quadrant of left female breast [facility]) Staging form: Breast, AJCC 7th Edition - Clinical stage from 11/16/2015: Stage IA (T1b, N0, M0) - Signed by Lloyd Huger, MD on 11/17/2015   Lloyd Huger, MD   09/14/2017 6:38 AM

## 2017-09-09 ENCOUNTER — Other Ambulatory Visit: Payer: Self-pay

## 2017-09-09 ENCOUNTER — Inpatient Hospital Stay: Payer: Medicare Other | Attending: Oncology | Admitting: Oncology

## 2017-09-09 VITALS — BP 134/83 | HR 65 | Temp 97.1°F | Resp 18 | Wt 161.7 lb

## 2017-09-09 DIAGNOSIS — F329 Major depressive disorder, single episode, unspecified: Secondary | ICD-10-CM | POA: Insufficient documentation

## 2017-09-09 DIAGNOSIS — Z923 Personal history of irradiation: Secondary | ICD-10-CM | POA: Diagnosis not present

## 2017-09-09 DIAGNOSIS — I10 Essential (primary) hypertension: Secondary | ICD-10-CM | POA: Insufficient documentation

## 2017-09-09 DIAGNOSIS — Z79811 Long term (current) use of aromatase inhibitors: Secondary | ICD-10-CM | POA: Diagnosis not present

## 2017-09-09 DIAGNOSIS — C50412 Malignant neoplasm of upper-outer quadrant of left female breast: Secondary | ICD-10-CM

## 2017-09-09 DIAGNOSIS — M1990 Unspecified osteoarthritis, unspecified site: Secondary | ICD-10-CM | POA: Diagnosis not present

## 2017-09-09 DIAGNOSIS — Z170 Estrogen receptor positive status [ER+]: Secondary | ICD-10-CM | POA: Diagnosis not present

## 2017-09-09 DIAGNOSIS — M810 Age-related osteoporosis without current pathological fracture: Secondary | ICD-10-CM | POA: Diagnosis not present

## 2017-09-09 DIAGNOSIS — K219 Gastro-esophageal reflux disease without esophagitis: Secondary | ICD-10-CM | POA: Diagnosis not present

## 2017-09-09 DIAGNOSIS — Z79899 Other long term (current) drug therapy: Secondary | ICD-10-CM | POA: Diagnosis not present

## 2017-09-09 DIAGNOSIS — M791 Myalgia, unspecified site: Secondary | ICD-10-CM | POA: Insufficient documentation

## 2017-09-09 MED ORDER — ALENDRONATE SODIUM 70 MG PO TABS: 70.0000 mg | ORAL_TABLET | ORAL | 3 refills | Status: DC

## 2017-09-09 NOTE — Progress Notes (Signed)
Here for follow up w CAP interpretor Victor.  Generalized bone pain all over esp at night per pt.

## 2017-09-19 ENCOUNTER — Other Ambulatory Visit: Payer: Self-pay | Admitting: *Deleted

## 2017-09-19 MED ORDER — ANASTROZOLE 1 MG PO TABS: 1.0000 mg | ORAL_TABLET | Freq: Every day | ORAL | 5 refills | Status: DC

## 2017-09-20 ENCOUNTER — Other Ambulatory Visit: Payer: Self-pay | Admitting: *Deleted

## 2017-09-20 MED ORDER — ANASTROZOLE 1 MG PO TABS: 1.0000 mg | ORAL_TABLET | Freq: Every day | ORAL | 0 refills | Status: DC

## 2017-10-17 ENCOUNTER — Other Ambulatory Visit: Payer: Self-pay | Admitting: Oncology
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
## 2017-12-26 ENCOUNTER — Other Ambulatory Visit: Payer: Self-pay

## 2017-12-26 ENCOUNTER — Inpatient Hospital Stay: Admission: RE | Admit: 2017-12-26 | Payer: Self-pay | Source: Ambulatory Visit

## 2018-02-11 ENCOUNTER — Ambulatory Visit
Admission: RE | Admit: 2018-02-11 | Discharge: 2018-02-11 | Disposition: A | Discharge status: Home / Self Care | Payer: Medicare Other | Source: Ambulatory Visit | Attending: Oncology | Admitting: Oncology

## 2018-02-11 DIAGNOSIS — C50412 Malignant neoplasm of upper-outer quadrant of left female breast: Secondary | ICD-10-CM

## 2018-02-11 HISTORY — DX: Personal history of irradiation: Z92.3

## 2018-03-10 ENCOUNTER — Ambulatory Visit: Payer: Medicare Other | Attending: Radiation Oncology | Admitting: Radiation Oncology

## 2018-03-17 ENCOUNTER — Other Ambulatory Visit: Payer: Self-pay | Admitting: *Deleted

## 2018-03-17 MED ORDER — ANASTROZOLE 1 MG PO TABS: 1.0000 mg | ORAL_TABLET | Freq: Every day | ORAL | 3 refills | Status: DC

## 2018-04-20 NOTE — Progress Notes (Signed)
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 Low risk Oncotype Dx of 15.  INTERVAL HISTORY: Patient returns to clinic today for routine 6-monthfollow-up.  She is tolerating anastrozole and Fosamax well without significant side effects.  She has occasional pain in her left breast which she associates with increased activity and movement.  She otherwise feels well and is asymptomatic.  She has no neurologic complaints. She denies any recent fevers or illnesses. She has a good appetite and denies weight loss. She denies any chest pain or shortness of breath. She denies any nausea, vomiting, constipation, or diarrhea. She has no urinary complaints.  Patient offers no further specific complaints today.  REVIEW OF SYSTEMS:   Review of Systems  Constitutional: Negative.  Negative for fever, malaise/fatigue and weight loss.  Respiratory: Negative.  Negative for cough and shortness of breath.   Cardiovascular: Negative.  Negative for chest pain and leg swelling.  Gastrointestinal: Negative.  Negative for abdominal pain.  Genitourinary: Negative.  Negative for dysuria.  Musculoskeletal: Negative.  Negative for myalgias and neck pain.  Skin: Negative.  Negative for rash.  Neurological: Negative.  Negative for sensory change, focal weakness, weakness and headaches.  Psychiatric/Behavioral: Negative.  Negative for depression. The patient is not nervous/anxious.     As per HPI. Otherwise, a complete review of systems is negative.  PAST MEDICAL HISTORY: Past Medical History:  Diagnosis Date  . Arthritis   . Cancer ([place]    breast  . Depression   . GERD (gastroesophageal reflux disease)   . Headache     . Hypertension   . Personal history of radiation therapy   . Post-menopausal   . UTI (urinary tract infection)     PAST SURGICAL HISTORY: Past Surgical History:  Procedure Laterality Date  . BREAST BIOPSY Left 11/08/2016   bx +  . BREAST EXCISIONAL BIOPSY Left 11/24/2016   lumpectomy with rad  . BREAST LUMPECTOMY Left 11/24/2016  . COLONOSCOPY  2007  . PARTIAL MASTECTOMY WITH NEEDLE LOCALIZATION Left 11/25/2015   Procedure: PARTIAL MASTECTOMY WITH NEEDLE LOCALIZATION;  Surgeon: JLeonie Green MD;  Location: [facility] ORS;  Service: General;  Laterality: Left;  . SENTINEL NODE BIOPSY Left 11/25/2015   Procedure: SENTINEL NODE BIOPSY;  Surgeon: JLeonie Green MD;  Location: [facility] ORS;  Service: General;  Laterality: Left;  . TUBAL LIGATION      FAMILY HISTORY: Reviewed and unchanged. No reported history of malignancy or chronic disease.     ADVANCED DIRECTIVES (Y/N):  N   HEALTH MAINTENANCE: Social History   Tobacco Use  . Smoking status: Never Smoker  . Smokeless tobacco: Never Used  Substance Use Topics  . Alcohol use: No  . Drug use: No     Colonoscopy:  PAP:  Bone density:  Lipid panel:  No Known Allergies  Current Outpatient Medications  Medication Sig Dispense Refill  . alendronate (FOSAMAX) 70 MG tablet Take 1 tablet (70 mg total) by mouth once a week. Take with a full glass of water on an empty stomach. 12 tablet 3  . amLODipine (NORVASC) 5 MG tablet Take 5 mg by mouth daily.    .Marland Kitchenanastrozole (ARIMIDEX) 1 MG tablet Take 1 tablet (1 mg total) by mouth  daily. 90 tablet 3  . bisoprolol-hydrochlorothiazide (ZIAC) 10-6.25 MG tablet Take 1 tablet by mouth 2 (two) times daily.    . Calcium Carb-Cholecalciferol 500-400 MG-UNIT TABS Take by mouth.    . estradiol (ESTRACE VAGINAL) 0.1 MG/GM vaginal cream Place vaginally.    [name] lisinopril (PRINIVIL,ZESTRIL) 40 MG tablet Take 40 mg by mouth at bedtime.    [name] omeprazole (PRILOSEC) 20 MG capsule Take 20 mg by mouth  daily.     . pravastatin (PRAVACHOL) 20 MG tablet Take 20 mg by mouth at bedtime.    . sertraline (ZOLOFT) 25 MG tablet Take 25 mg by mouth daily.     No current facility-administered medications for this visit.     OBJECTIVE: Vitals:   04/24/18 1441  BP: 126/77  Pulse: 68  Temp: (!) 97.1 F (36.2 C)     Body mass index is 30.78 kg/m.    ECOG FS:0 - Asymptomatic  General: Well-developed, well-nourished, no acute distress. Eyes: Pink conjunctiva, anicteric sclera. HEENT: Normocephalic, moist mucous membranes. Breast: Bilateral breast and axilla without lumps or masses.  Left breast nontender to palpation. Lungs: Clear to auscultation bilaterally. Heart: Regular rate and rhythm. No rubs, murmurs, or gallops. Abdomen: Soft, nontender, nondistended. No organomegaly noted, normoactive bowel sounds. Musculoskeletal: No edema, cyanosis, or clubbing. Neuro: Alert, answering all questions appropriately. Cranial nerves grossly intact. Skin: No rashes or petechiae noted. Psych: Normal affect.  LAB RESULTS:  Lab Results  Component Value Date   NA 134 (L) 06/24/2016   K 4.5 06/24/2016   CL 100 (L) 06/24/2016   CO2 27 06/24/2016   GLUCOSE 103 (H) 06/24/2016   BUN <5 (L) 06/24/2016   CREATININE 0.52 06/24/2016   CALCIUM 9.6 06/24/2016   GFRNONAA >60 06/24/2016   GFRAA >60 06/24/2016    Lab Results  Component Value Date   WBC 5.5 02/14/2016   HGB 12.6 02/14/2016   HCT 37.5 02/14/2016   MCV 83.1 02/14/2016   PLT 198 02/14/2016     STUDIES: Dg Bone Density  Result Date: 04/21/2018 EXAM: DUAL X-RAY ABSORPTIOMETRY (DXA) FOR BONE MINERAL DENSITY IMPRESSION: Technologist:VLM Your patient Jasmine Gardner completed a BMD test on 04/21/2018 using the [place] (analysis version: 14.10) manufactured by [employer]. The following summarizes the results of our evaluation. PATIENT BIOGRAPHICAL: Name: Jasmine Gardner, Jasmine Gardner Patient ID: [id number] Birth Date: 25-Feb-1951 Height:  61.0 in. Gender: Female Exam Date: 04/21/2018 Weight: 159.0 lbs. Indications: Osteoporotic, Postmenopausal, History of Breast Cancer, History of Radiation, Height Loss Fractures: Treatments: Arimidex, Calcium, Fosamax ASSESSMENT: The BMD measured at Forearm Radius 33% is 0.609 g/cm2 with a T-score of -3.0. This patient is considered osteoporotic according to [facility]) criteria. Lumbar spine was not utilized due to advanced degenerative changes. The scan quality is good. Site Region Measured Measured WHO Young Adult BMD Date       Age      Classification T-score DualFemur Neck Right 04/21/2018 68.8 Osteopenia -1.4 0.838 g/cm2 DualFemur Neck Right 04/17/2017 67.8 Osteopenia -1.3 0.858 g/cm2 DualFemur Neck Right 10/25/2015 66.3 Osteopenia -1.4 0.839 g/cm2 DualFemur Total Mean 04/21/2018 68.8 Normal -1.0 0.888 g/cm2 DualFemur Total Mean 04/17/2017 67.8 Normal -0.7 0.914 g/cm2 DualFemur Total Mean 10/25/2015 66.3 Normal -0.7 0.924 g/cm2 Left Forearm Radius 33% 04/21/2018 68.8 Osteoporosis -3.0 0.609 g/cm2 Left Forearm Radius 33% 04/17/2017 67.8 Osteoporosis -3.3 0.588 g/cm2 World Health Organization [facility]) criteria for post-menopausal, Caucasian Women: Normal:       T-score at or above -1 SD Osteopenia:  T-score between -1 and -2.5 SD Osteoporosis: T-score at or below -2.5 SD RECOMMENDATIONS: 1. All patients should optimize calcium and vitamin D intake. 2. Consider FDA-approved medical therapies in postmenopausal women and men aged 51 years and older, based on the following: a. A hip or vertebral(clinical or morphometric) fracture b. T-score < -2.5 at the femoral neck or spine after appropriate evaluation to exclude secondary causes c. Low bone mass (T-score between -1.0 and -2.5 at the femoral neck or spine) and a 10-year probability of a hip fracture > 3% or a 10-year probability of a major osteoporosis-related fracture > 20% based on the [place]-adapted WHO algorithm d. Clinician judgment and/or  patient preferences may indicate treatment for people with 10-year fracture probabilities above or below these levels FOLLOW-UP: People with diagnosed cases of osteoporosis or at high risk for fracture should have regular bone mineral density tests. For patients eligible for Medicare, routine testing is allowed once every 2 years. The testing frequency can be increased to one year for patients who have rapidly progressing disease, those who are receiving or discontinuing medical therapy to restore bone mass, or have additional risk factors. Electronically Signed   By: Earle Gell M.D.   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 Low risk Oncotype DX and 15.  PLAN:    1. 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  She also completed adjuvant XRT.  Given her low risk Oncotype, she did not require adjuvant chemotherapy. Patient could not tolerate letrozole, therefore was switched to anastrozole.  Continue 5 years of treatment completing in June 2023.  Patient's most recent mammogram on February 11, 2018 was reported as BI-RADS 2.  Repeat in November 2020.  Return to clinic in 6 months for routine evaluation.   2. Osteoporosis: Patient's most recent bone mineral density on April 21, 2018 revealed an improved T score of -3.0.  Previously in January 2019, her T score was reported -3.3.  Continue Fosamax, calcium, and vitamin D.  Repeat in January 2021.  3. Depression: Continue Zoloft as prescribed.   The entire visit was done in the presence of an interpreter.  Patient expressed understanding and was in agreement with this plan. She also understands that She can call clinic at any time with any questions, concerns, or complaints.   Cancer Staging Primary cancer of upper  outer quadrant of left female breast [facility]) Staging form: Breast, AJCC 7th Edition - Clinical stage from 11/16/2015: Stage IA (T1b, N0, M0) - Signed by Lloyd Huger, MD on 11/17/2015   Lloyd Huger, MD   04/25/2018 9:36 AM

## 2018-04-21 ENCOUNTER — Ambulatory Visit
Admission: RE | Admit: 2018-04-21 | Discharge: 2018-04-21 | Disposition: A | Discharge status: Home / Self Care | Payer: Medicare Other | Source: Ambulatory Visit | Attending: Oncology | Admitting: Oncology

## 2018-04-21 DIAGNOSIS — C50412 Malignant neoplasm of upper-outer quadrant of left female breast: Secondary | ICD-10-CM | POA: Insufficient documentation

## 2018-04-21 DIAGNOSIS — M810 Age-related osteoporosis without current pathological fracture: Secondary | ICD-10-CM | POA: Diagnosis not present

## 2018-04-24 ENCOUNTER — Inpatient Hospital Stay: Payer: Medicare Other | Attending: Oncology | Admitting: Oncology

## 2018-04-24 ENCOUNTER — Other Ambulatory Visit: Payer: Self-pay

## 2018-04-24 ENCOUNTER — Encounter (INDEPENDENT_AMBULATORY_CARE_PROVIDER_SITE_OTHER): Payer: Self-pay

## 2018-04-24 VITALS — BP 126/77 | HR 68 | Temp 97.1°F | Wt 162.9 lb

## 2018-04-24 DIAGNOSIS — F329 Major depressive disorder, single episode, unspecified: Secondary | ICD-10-CM | POA: Diagnosis not present

## 2018-04-24 DIAGNOSIS — C50412 Malignant neoplasm of upper-outer quadrant of left female breast: Secondary | ICD-10-CM

## 2018-04-24 DIAGNOSIS — Z170 Estrogen receptor positive status [ER+]: Secondary | ICD-10-CM | POA: Insufficient documentation

## 2018-04-24 DIAGNOSIS — I10 Essential (primary) hypertension: Secondary | ICD-10-CM | POA: Insufficient documentation

## 2018-04-24 DIAGNOSIS — Z923 Personal history of irradiation: Secondary | ICD-10-CM

## 2018-04-24 DIAGNOSIS — Z79899 Other long term (current) drug therapy: Secondary | ICD-10-CM | POA: Insufficient documentation

## 2018-04-24 NOTE — Patient Instructions (Signed)
Take Arimidex (anastrozole) once a day Take Fosamax (alendronate) once a week

## 2018-04-24 NOTE — Progress Notes (Signed)
Patient is here today to follow up on her primary cancer of upper outer quadrant of left female breast. Patient had her last mammogram done on 02/11/2018 and Bone density on 04/21/2018 and they were both benign. Patient denied nipple discharge, skin discoloration and lumps/knots. Patient stated that she had been having pain on her left breast for a while that doesn't get any better and would like to know if she is able to get any pain medication.

## 2018-05-27 ENCOUNTER — Encounter: Payer: Self-pay | Admitting: *Deleted

## 2018-11-01 NOTE — Progress Notes (Deleted)
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 Low risk Oncotype Dx of 15.  INTERVAL HISTORY: Patient returns to clinic today for routine 53-monthfollow-up.  She is tolerating anastrozole and Fosamax well without significant side effects.  She has occasional pain in her left breast which she associates with increased activity and movement.  She otherwise feels well and is asymptomatic.  She has no neurologic complaints. She denies any recent fevers or illnesses. She has a good appetite and denies weight loss. She denies any chest pain or shortness of breath. She denies any nausea, vomiting, constipation, or diarrhea. She has no urinary complaints.  Patient offers no further specific complaints today.  REVIEW OF SYSTEMS:   Review of Systems  Constitutional: Negative.  Negative for fever, malaise/fatigue and weight loss.  Respiratory: Negative.  Negative for cough and shortness of breath.   Cardiovascular: Negative.  Negative for chest pain and leg swelling.  Gastrointestinal: Negative.  Negative for abdominal pain.  Genitourinary: Negative.  Negative for dysuria.  Musculoskeletal: Negative.  Negative for myalgias and neck pain.  Skin: Negative.  Negative for rash.  Neurological: Negative.  Negative for sensory change, focal weakness, weakness and headaches.  Psychiatric/Behavioral: Negative.  Negative for depression. The patient is not nervous/anxious.     As per HPI. Otherwise, a complete review of systems is negative.  PAST MEDICAL HISTORY: Past Medical History:  Diagnosis Date  . Arthritis   . Cancer ([place]    breast  . Depression   . GERD (gastroesophageal reflux disease)   . Headache    . Hypertension   . Personal history of radiation therapy   . Post-menopausal   . UTI (urinary tract infection)     PAST SURGICAL HISTORY: Past Surgical History:  Procedure Laterality Date  . BREAST BIOPSY Left 11/08/2016   bx +  . BREAST EXCISIONAL BIOPSY Left 11/24/2016   lumpectomy with rad  . BREAST LUMPECTOMY Left 11/24/2016  . COLONOSCOPY  2007  . PARTIAL MASTECTOMY WITH NEEDLE LOCALIZATION Left 11/25/2015   Procedure: PARTIAL MASTECTOMY WITH NEEDLE LOCALIZATION;  Surgeon: JLeonie Green MD;  Location: [facility] ORS;  Service: General;  Laterality: Left;  . SENTINEL NODE BIOPSY Left 11/25/2015   Procedure: SENTINEL NODE BIOPSY;  Surgeon: JLeonie Green MD;  Location: [facility] ORS;  Service: General;  Laterality: Left;  . TUBAL LIGATION      FAMILY HISTORY: Reviewed and unchanged. No reported history of malignancy or chronic disease.     ADVANCED DIRECTIVES (Y/N):  N   HEALTH MAINTENANCE: Social History   Tobacco Use  . Smoking status: Never Smoker  . Smokeless tobacco: Never Used  Substance Use Topics  . Alcohol use: No  . Drug use: No     Colonoscopy:  PAP:  Bone density:  Lipid panel:  No Known Allergies  Current Outpatient Medications  Medication Sig Dispense Refill  . alendronate (FOSAMAX) 70 MG tablet Take 1 tablet (70 mg total) by mouth once a week. Take with a full glass of water on an empty stomach. 12 tablet 3  . amLODipine (NORVASC) 5 MG tablet Take 5 mg by mouth daily.    .Marland Kitchenanastrozole (ARIMIDEX) 1 MG tablet Take 1 tablet (1 mg total) by mouth daily.  90 tablet 3  . bisoprolol-hydrochlorothiazide (ZIAC) 10-6.25 MG tablet Take 1 tablet by mouth 2 (two) times daily.    . Calcium Carb-Cholecalciferol 500-400 MG-UNIT TABS Take by mouth.    . estradiol (ESTRACE VAGINAL) 0.1 MG/GM vaginal cream Place vaginally.    [name] lisinopril (PRINIVIL,ZESTRIL) 40 MG tablet Take 40 mg by mouth at bedtime.    [name] omeprazole (PRILOSEC) 20 MG capsule Take 20 mg by mouth  daily.     . pravastatin (PRAVACHOL) 20 MG tablet Take 20 mg by mouth at bedtime.    . sertraline (ZOLOFT) 25 MG tablet Take 25 mg by mouth daily.     No current facility-administered medications for this visit.     OBJECTIVE: There were no vitals filed for this visit.   There is no height or weight on file to calculate BMI.    ECOG FS:0 - Asymptomatic  General: Well-developed, well-nourished, no acute distress. Eyes: Pink conjunctiva, anicteric sclera. HEENT: Normocephalic, moist mucous membranes. Breast: Bilateral breast and axilla without lumps or masses.  Left breast nontender to palpation. Lungs: Clear to auscultation bilaterally. Heart: Regular rate and rhythm. No rubs, murmurs, or gallops. Abdomen: Soft, nontender, nondistended. No organomegaly noted, normoactive bowel sounds. Musculoskeletal: No edema, cyanosis, or clubbing. Neuro: Alert, answering all questions appropriately. Cranial nerves grossly intact. Skin: No rashes or petechiae noted. Psych: Normal affect.  LAB RESULTS:  Lab Results  Component Value Date   NA 134 (L) 06/24/2016   K 4.5 06/24/2016   CL 100 (L) 06/24/2016   CO2 27 06/24/2016   GLUCOSE 103 (H) 06/24/2016   BUN <5 (L) 06/24/2016   CREATININE 0.52 06/24/2016   CALCIUM 9.6 06/24/2016   GFRNONAA >60 06/24/2016   GFRAA >60 06/24/2016    Lab Results  Component Value Date   WBC 5.5 02/14/2016   HGB 12.6 02/14/2016   HCT 37.5 02/14/2016   MCV 83.1 02/14/2016   PLT 198 02/14/2016     STUDIES: No results found.  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 Low risk Oncotype DX and 15.  PLAN:    1. 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  She also completed adjuvant XRT.  Given her low risk Oncotype, she did not require adjuvant  chemotherapy. Patient could not tolerate letrozole, therefore was switched to anastrozole.  Continue 5 years of treatment completing in June 2023.  Patient's most recent mammogram on February 11, 2018 was reported as BI-RADS 2.  Repeat in November 2020.  Return to clinic in 6 months for routine evaluation.   2. Osteoporosis: Patient's most recent bone mineral density on April 21, 2018 revealed an improved T score of -3.0.  Previously in January 2019, her T score was reported -3.3.  Continue Fosamax, calcium, and vitamin D.  Repeat in January 2021.  3. Depression: Continue Zoloft as prescribed.   The entire visit was done in the presence of an interpreter.  Patient expressed understanding and was in agreement with this plan. She also understands that She can call clinic at any time with any questions, concerns, or complaints.   Cancer Staging Primary cancer of upper outer quadrant of left female breast [facility]) Staging form: Breast, AJCC 7th Edition - Clinical stage from 11/16/2015: Stage IA (T1b, N0, M0) - Signed by Lloyd Huger, MD on 11/17/2015   Kathlene November  Grayland Ormond, MD   11/01/2018 11:27 AM

## 2018-11-07 ENCOUNTER — Inpatient Hospital Stay: Payer: Medicare Other | Admitting: Oncology

## 2018-11-10 ENCOUNTER — Inpatient Hospital Stay: Payer: Medicare Other | Attending: Oncology | Admitting: Oncology

## 2018-11-10 ENCOUNTER — Encounter: Payer: Self-pay | Admitting: Oncology

## 2018-11-10 ENCOUNTER — Encounter (INDEPENDENT_AMBULATORY_CARE_PROVIDER_SITE_OTHER): Payer: Self-pay

## 2018-11-10 ENCOUNTER — Other Ambulatory Visit: Payer: Self-pay

## 2018-11-10 VITALS — BP 163/87 | HR 74 | Temp 97.2°F | Wt 162.0 lb

## 2018-11-10 DIAGNOSIS — Z170 Estrogen receptor positive status [ER+]: Secondary | ICD-10-CM | POA: Diagnosis not present

## 2018-11-10 DIAGNOSIS — Z9223 Personal history of estrogen therapy: Secondary | ICD-10-CM | POA: Diagnosis not present

## 2018-11-10 DIAGNOSIS — Z79899 Other long term (current) drug therapy: Secondary | ICD-10-CM | POA: Insufficient documentation

## 2018-11-10 DIAGNOSIS — I10 Essential (primary) hypertension: Secondary | ICD-10-CM | POA: Diagnosis not present

## 2018-11-10 DIAGNOSIS — K219 Gastro-esophageal reflux disease without esophagitis: Secondary | ICD-10-CM | POA: Insufficient documentation

## 2018-11-10 DIAGNOSIS — F329 Major depressive disorder, single episode, unspecified: Secondary | ICD-10-CM | POA: Insufficient documentation

## 2018-11-10 DIAGNOSIS — M2550 Pain in unspecified joint: Secondary | ICD-10-CM | POA: Diagnosis not present

## 2018-11-10 DIAGNOSIS — M1990 Unspecified osteoarthritis, unspecified site: Secondary | ICD-10-CM | POA: Insufficient documentation

## 2018-11-10 DIAGNOSIS — C50412 Malignant neoplasm of upper-outer quadrant of left female breast: Secondary | ICD-10-CM | POA: Insufficient documentation

## 2018-11-10 DIAGNOSIS — M810 Age-related osteoporosis without current pathological fracture: Secondary | ICD-10-CM | POA: Diagnosis not present

## 2018-11-10 DIAGNOSIS — Z79810 Long term (current) use of selective estrogen receptor modulators (SERMs): Secondary | ICD-10-CM | POA: Diagnosis not present

## 2018-11-10 MED ORDER — TAMOXIFEN CITRATE 20 MG PO TABS: 20.0000 mg | ORAL_TABLET | Freq: Every day | ORAL | 11 refills | Status: DC

## 2018-11-10 NOTE — Progress Notes (Signed)
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 Low risk Oncotype Dx of 15.  INTERVAL HISTORY: Patient returns to clinic today for routine 25-monthevaluation.  She complains of occasional left breast pain.  She also has noted increasing joint pain, particularly in her right knee. She otherwise is tolerating anastrozole and Fosamax well.  She has no neurologic complaints. She denies any recent fevers or illnesses. She has a good appetite and denies weight loss.  She denies any chest pain, shortness of breath, cough, or hemoptysis.  She denies any nausea, vomiting, constipation, or diarrhea. She has no urinary complaints.  Patient offers no further specific complaints today.    REVIEW OF SYSTEMS:   Review of Systems  Constitutional: Negative.  Negative for fever, malaise/fatigue and weight loss.  Respiratory: Negative.  Negative for cough and shortness of breath.   Cardiovascular: Negative.  Negative for chest pain and leg swelling.  Gastrointestinal: Negative.  Negative for abdominal pain.  Genitourinary: Negative.  Negative for dysuria.  Musculoskeletal: Positive for joint pain. Negative for myalgias and neck pain.  Skin: Negative.  Negative for rash.  Neurological: Negative.  Negative for sensory change, focal weakness, weakness and headaches.  Psychiatric/Behavioral: Negative.  Negative for depression. The patient is not nervous/anxious.     As per HPI. Otherwise, a complete review of systems is negative.  PAST MEDICAL HISTORY: Past Medical History:  Diagnosis Date  . Arthritis   . Cancer ([place]    breast  . Depression   . GERD (gastroesophageal reflux disease)   . Headache   .  Hypertension   . Personal history of radiation therapy   . Post-menopausal   . UTI (urinary tract infection)     PAST SURGICAL HISTORY: Past Surgical History:  Procedure Laterality Date  . BREAST BIOPSY Left 11/08/2016   bx +  . BREAST EXCISIONAL BIOPSY Left 11/24/2016   lumpectomy with rad  . BREAST LUMPECTOMY Left 11/24/2016  . COLONOSCOPY  2007  . PARTIAL MASTECTOMY WITH NEEDLE LOCALIZATION Left 11/25/2015   Procedure: PARTIAL MASTECTOMY WITH NEEDLE LOCALIZATION;  Surgeon: JLeonie Green MD;  Location: [facility] ORS;  Service: General;  Laterality: Left;  . SENTINEL NODE BIOPSY Left 11/25/2015   Procedure: SENTINEL NODE BIOPSY;  Surgeon: JLeonie Green MD;  Location: [facility] ORS;  Service: General;  Laterality: Left;  . TUBAL LIGATION      FAMILY HISTORY: Reviewed and unchanged. No reported history of malignancy or chronic disease.     ADVANCED DIRECTIVES (Y/N):  N   HEALTH MAINTENANCE: Social History   Tobacco Use  . Smoking status: Never Smoker  . Smokeless tobacco: Never Used  Substance Use Topics  . Alcohol use: No  . Drug use: No     Colonoscopy:  PAP:  Bone density:  Lipid panel:  No Known Allergies  Current Outpatient Medications  Medication Sig Dispense Refill  . alendronate (FOSAMAX) 70 MG tablet Take 1 tablet (70 mg total) by mouth once a week. Take with a full glass of water on an empty stomach. 12 tablet 3  . amLODipine (NORVASC) 5 MG tablet Take 5 mg by mouth daily.    . bisoprolol-hydrochlorothiazide (ZIAC) 10-6.25 MG tablet Take 1 tablet by mouth 2 (two) times daily.    .[name]  Calcium Carb-Cholecalciferol 500-400 MG-UNIT TABS Take by mouth.    . estradiol (ESTRACE VAGINAL) 0.1 MG/GM vaginal cream Place vaginally.    [name] lisinopril (PRINIVIL,ZESTRIL) 40 MG tablet Take 40 mg by mouth at bedtime.    [name] omeprazole (PRILOSEC) 20 MG capsule Take 20 mg by mouth daily.     . pravastatin (PRAVACHOL) 20 MG tablet Take 20 mg by mouth at bedtime.    . sertraline  (ZOLOFT) 25 MG tablet Take 25 mg by mouth daily.    . tamoxifen (NOLVADEX) 20 MG tablet Take 1 tablet (20 mg total) by mouth daily. 30 tablet 11   No current facility-administered medications for this visit.     OBJECTIVE: Vitals:   11/10/18 1547  BP: (!) 163/87  Pulse: 74  Temp: (!) 97.2 F (36.2 C)     Body mass index is 30.61 kg/m.    ECOG FS:0 - Asymptomatic  General: Well-developed, well-nourished, no acute distress. Eyes: Pink conjunctiva, anicteric sclera. HEENT: Normocephalic, moist mucous membranes. Breast: Bilateral breast and axilla without lumps or masses. Lungs: Clear to auscultation bilaterally. Heart: Regular rate and rhythm. No rubs, murmurs, or gallops. Abdomen: Soft, nontender, nondistended. No organomegaly noted, normoactive bowel sounds. Musculoskeletal: No edema, cyanosis, or clubbing. Neuro: Alert, answering all questions appropriately. Cranial nerves grossly intact. Skin: No rashes or petechiae noted. Psych: Normal affect.  LAB RESULTS:  Lab Results  Component Value Date   NA 134 (L) 06/24/2016   K 4.5 06/24/2016   CL 100 (L) 06/24/2016   CO2 27 06/24/2016   GLUCOSE 103 (H) 06/24/2016   BUN <5 (L) 06/24/2016   CREATININE 0.52 06/24/2016   CALCIUM 9.6 06/24/2016   GFRNONAA >60 06/24/2016   GFRAA >60 06/24/2016    Lab Results  Component Value Date   WBC 5.5 02/14/2016   HGB 12.6 02/14/2016   HCT 37.5 02/14/2016   MCV 83.1 02/14/2016   PLT 198 02/14/2016     STUDIES: No results found.  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 Low risk Oncotype DX and 15.  PLAN:    1. 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  She also completed adjuvant XRT.  Given her low risk Oncotype, she did not require adjuvant chemotherapy. Patient could  not tolerate letrozole, therefore was switched to anastrozole.  Because of her persistent joint pain as well as osteoporosis, will discontinue anastrozole and patient was given a prescription for tamoxifen.  Complete treatment in June 2023.  Patient's most recent mammogram on February 11, 2018 was reported as BI-RADS 2.  Repeat in November 2020.  Return to clinic in 6 months for routine evaluation.   2. Osteoporosis: Patient's most recent bone mineral density on April 21, 2018 revealed an improved T score of -3.0.  Previously in January 2019, her T score was reported -3.3.  Continue Fosamax, calcium, and vitamin D.  Repeat in January 2021. 3. Depression: Continue Zoloft as prescribed. 4.  Joint pain: Discontinue anastrozole as above.  Patient also has been instructed to contact her PCP for further evaluation.   The entire visit was done in the presence of an interpreter.  Patient expressed understanding and was in agreement with this plan. She also understands that She can call clinic at any time with any questions, concerns, or complaints.   Cancer Staging Primary cancer of upper  outer quadrant of left female breast [facility]) Staging form: Breast, AJCC 7th Edition - Clinical stage from 11/16/2015: Stage IA (T1b, N0, M0) - Signed by Lloyd Huger, MD on 11/17/2015   Lloyd Huger, MD   11/10/2018 4:51 PM

## 2018-11-10 NOTE — Progress Notes (Signed)
Patient stated that she had been doing okay. Patient stated that she has had some sharp pain intermittently. Patient also stated that she had been having pain on her lower extremities.

## 2019-01-29 ENCOUNTER — Ambulatory Visit: Payer: Medicare Other | Admitting: Urology

## 2019-01-29 ENCOUNTER — Encounter: Payer: Self-pay | Admitting: Urology

## 2019-02-13 ENCOUNTER — Ambulatory Visit
Admission: RE | Admit: 2019-02-13 | Discharge: 2019-02-13 | Disposition: A | Discharge status: Home / Self Care | Payer: Medicare Other | Source: Ambulatory Visit | Attending: Oncology | Admitting: Oncology

## 2019-02-13 DIAGNOSIS — C50412 Malignant neoplasm of upper-outer quadrant of left female breast: Secondary | ICD-10-CM | POA: Diagnosis present

## 2019-04-23 ENCOUNTER — Ambulatory Visit
Admission: RE | Admit: 2019-04-23 | Discharge: 2019-04-23 | Disposition: A | Discharge status: Home / Self Care | Payer: Medicare Other | Source: Ambulatory Visit | Attending: Oncology | Admitting: Oncology

## 2019-04-23 DIAGNOSIS — M810 Age-related osteoporosis without current pathological fracture: Secondary | ICD-10-CM | POA: Diagnosis present

## 2019-05-14 NOTE — Progress Notes (Signed)
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 Low risk Oncotype Dx of 15.  INTERVAL HISTORY: Patient returns to clinic today for routine 79-monthevaluation.  She has chronic bilateral knee pain, but otherwise feels well.  She continues to tolerate tamoxifen and Fosamax without significant side effects. She has no neurologic complaints. She denies any recent fevers or illnesses. She has a good appetite and denies weight loss.  She denies any chest pain, shortness of breath, cough, or hemoptysis.  She denies any nausea, vomiting, constipation, or diarrhea. She has no urinary complaints.  Patient offers no further specific complaints today.  REVIEW OF SYSTEMS:   Review of Systems  Constitutional: Negative.  Negative for fever, malaise/fatigue and weight loss.  Respiratory: Negative.  Negative for cough and shortness of breath.   Cardiovascular: Negative.  Negative for chest pain and leg swelling.  Gastrointestinal: Negative.  Negative for abdominal pain.  Genitourinary: Negative.  Negative for dysuria.  Musculoskeletal: Positive for joint pain. Negative for myalgias and neck pain.  Skin: Negative.  Negative for rash.  Neurological: Negative.  Negative for sensory change, focal weakness, weakness and headaches.  Psychiatric/Behavioral: Negative.  Negative for depression. The patient is not nervous/anxious.     As per HPI. Otherwise, a complete review of systems is negative.  PAST MEDICAL HISTORY: Past Medical History:  Diagnosis Date  . Arthritis   . [place]    bre[place]  . Depression   . GERD (gastroesophageal reflux disease)   . Headache   . Hypertension   . Personal history of radiation  therapy   . Post-menopausal   . UTI (urinary tract infection)     PAST SURGICAL HISTORY: Past Surgical History:  Procedure Laterality Date  . BREAST BIOPSY Left 11/08/2016   bx +  . BREAST EXCISIONAL BIOPSY Left 11/24/2016   lumpectomy with rad  . BREAST LUMPECTOMY Left 11/24/2016  . COLONOSCOPY  2007  . PARTIAL MASTECTOMY WITH NEEDLE LOCALIZATION Left 11/25/2015   Procedure: PARTIAL MASTECTOMY WITH NEEDLE LOCALIZATION;  Surgeon: JLeonie Green MD;  Location: [facility] ORS;  Service: General;  Laterality: Left;  . SENTINEL NODE BIOPSY Left 11/25/2015   Procedure: SENTINEL NODE BIOPSY;  Surgeon: JLeonie Green MD;  Location: [facility] ORS;  Service: General;  Laterality: Left;  . TUBAL LIGATION      FAMILY HISTORY: Reviewed and unchanged. No reported history of malignancy or chronic disease.     ADVANCED DIRECTIVES (Y/N):  N   HEALTH MAINTENANCE: Social History   Tobacco Use  . Smoking status: Never Smoker  . Smokeless tobacco: Never Used  Substance Use Topics  . Alcohol use: No  . Drug use: No     Colonoscopy:  PAP:  Bone density:  Lipid panel:  No Known Allergies  Current Outpatient Medications  Medication Sig Dispense Refill  . alendronate (FOSAMAX) 70 MG tablet Take 1 tablet (70 mg total) by mouth once a week. Take with a full glass of water on an empty stomach. 12 tablet 3  . amLODipine (NORVASC) 5 MG tablet Take 5 mg by mouth daily.    . bisoprolol-hydrochlorothiazide (ZIAC) 10-6.25 MG tablet Take 1 tablet by mouth 2 (two) times daily.    . Calcium Carb-Cholecalciferol 500-400 MG-UNIT TABS  Take by mouth.    [name] lisinopril (PRINIVIL,ZESTRIL) 40 MG tablet Take 40 mg by mouth at bedtime.    [name] omeprazole (PRILOSEC) 20 MG capsule Take 20 mg by mouth daily.     . sertraline (ZOLOFT) 25 MG tablet Take 25 mg by mouth daily.    . tamoxifen (NOLVADEX) 20 MG tablet Take 1 tablet (20 mg total) by mouth daily. 30 tablet 11  . estradiol (ESTRACE VAGINAL) 0.1 MG/GM vaginal  cream Place vaginally.    . pravastatin (PRAVACHOL) 20 MG tablet Take 20 mg by mouth at bedtime.     No current facility-administered medications for this visit.    OBJECTIVE: Vitals:   05/21/19 1438  BP: (!) 156/88  Pulse: 97  Temp: (!) 96.8 F (36 C)     Body mass index is 31.42 kg/m.    ECOG FS:0 - Asymptomatic  General: Well-developed, well-nourished, no acute distress. Eyes: Pink conjunctiva, anicteric sclera. HEENT: Normocephalic, moist mucous membranes. Breast: Exam deferred today. Lungs: No audible wheezing or coughing. Heart: Regular rate and rhythm. Abdomen: Soft, nontender, no obvious distention. Musculoskeletal: No edema, cyanosis, or clubbing. Neuro: Alert, answering all questions appropriately. Cranial nerves grossly intact. Skin: No rashes or petechiae noted. Psych: Normal affect.   LAB RESULTS:  Lab Results  Component Value Date   NA 134 (L) 06/24/2016   K 4.5 06/24/2016   CL 100 (L) 06/24/2016   CO2 27 06/24/2016   GLUCOSE 103 (H) 06/24/2016   BUN <5 (L) 06/24/2016   CREATININE 0.52 06/24/2016   CALCIUM 9.6 06/24/2016   GFRNONAA >60 06/24/2016   GFRAA >60 06/24/2016    Lab Results  Component Value Date   WBC 5.5 02/14/2016   HGB 12.6 02/14/2016   HCT 37.5 02/14/2016   MCV 83.1 02/14/2016   PLT 198 02/14/2016     STUDIES: DG Bone Density  Result Date: 04/23/2019 EXAM: DUAL X-RAY ABSORPTIOMETRY (DXA) FOR BONE MINERAL DENSITY IMPRESSION: Your patient Jasmine Gardner completed a BMD test on 04/23/2019 using the [employer] (software version: 14.10) manufactured by [employer]. The following summarizes the results of our evaluation. Technologist: MTB PATIENT BIOGRAPHICAL: Name: Jasmine Gardner Patient ID: [id number] Birth Date: June 07, 1950 Height: 60.0 in. Gender: Female Exam Date: 04/23/2019 Weight: 164.0 lbs. Indications: Height Loss, History of Breast Cancer, History of Radiation, Osteoarthritis, Osteoporotic,  Postmenopausal Fractures: Treatments: Calcium, Fosamax, Tamoxifen DENSITOMETRY RESULTS: Site         Region     Measured Date Measured Age WHO Classification Young Adult T-score BMD         %Change vs. Previous Significant Change (*) DualFemur Neck Right 04/23/2019 69.8 Osteopenia -1.4 0.850 g/cm2 1.4% - DualFemur Neck Right 04/21/2018 68.8 Osteopenia -1.4 0.838 g/cm2 -2.3% - DualFemur Neck Right 04/17/2017 67.8 Osteopenia -1.3 0.858 g/cm2 2.3% - DualFemur Neck Right 10/25/2015 66.3 Osteopenia -1.4 0.839 g/cm2 - - DualFemur Total Mean 04/23/2019 69.8 Normal -0.6 0.936 g/cm2 5.4% Yes DualFemur Total Mean 04/21/2018 68.8 Normal -1.0 0.888 g/cm2 -2.8% - DualFemur Total Mean 04/17/2017 67.8 Normal -0.7 0.914 g/cm2 -1.1% - DualFemur Total Mean 10/25/2015 66.3 Normal -0.7 0.924 g/cm2 - - Left Forearm Radius 33% 04/23/2019 69.8 Osteoporosis -3.1 0.604 g/cm2 -0.8% - Left Forearm Radius 33% 04/21/2018 68.8 Osteoporosis -3.0 0.609 g/cm2 3.6% - Left Forearm Radius 33% 04/17/2017 67.8 Osteoporosis -3.3 0.588 g/cm2 - - ASSESSMENT: The BMD measured at Forearm Radius 33% is 0.604 g/cm2 with a T-score of -3.1. This patient is considered osteoporotic according to [facility]  Organization (WHO) criteria. The scan quality is good. Lumbar spine was not utilized due to advanced degenerative changes. Compared with prior study, there has been significant increase in the total hip. [facility]) criteria for post-menopausal, Caucasian Women: Normal:       T-score at or above -1 SD Osteopenia:   T-score between -1 and -2.5 SD Osteoporosis: T-score at or below -2.5 SD RECOMMENDATIONS: 1. All patients should optimize calcium and vitamin D intake. 2. Consider FDA-approved medical therapies in postmenopausal women and men aged 6 years and older, based on the following: a. A hip or vertebral(clinical or morphometric) fracture b. T-score < -2.5 at the femoral neck or spine after appropriate evaluation to exclude secondary  causes c. Low bone mass (T-score between -1.0 and -2.5 at the femoral neck or spine) and a 10-year probability of a hip fracture > 3% or a 10-year probability of a major osteoporosis-related fracture > 20% based on the [place]-adapted WHO algorithm d. Clinician judgment and/or patient preferences may indicate treatment for people with 10-year fracture probabilities above or below these levels FOLLOW-UP: People with diagnosed cases of osteoporosis or at high risk for fracture should have regular bone mineral density tests. For patients eligible for Medicare, routine testing is allowed once every 2 years. The testing frequency can be increased to one year for patients who have rapidly progressing disease, those who are receiving or discontinuing medical therapy to restore bone mass, or have additional risk factors. I have reviewed this report, and agree with the above findings. [facility] Electronically Signed   By: Lowella Grip III M.D.   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 Low risk Oncotype DX and 15.  PLAN:    1. 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  She also completed adjuvant XRT.  Given her low risk Oncotype, she did not require adjuvant chemotherapy. Patient could not tolerate letrozole, therefore was switched to anastrozole.  Because of her osteoporosis, anastrozole was discontinued and she was given a prescription for tamoxifen.  Complete treatment in June 2023.  Her most recent mammogram on February 13, 2019 was reported BI-RADS 2.  Repeat in November 2021.  Return to clinic in 6 months for routine evaluation. 2. Osteoporosis: Patient's most recent bone mineral density on April 23, 2019 revealed a T score of -3.1 which is essentially  unchanged from 1 year prior when the T score was reported -3.0.  Continue Fosamax, calcium, and vitamin D.  Repeat in January 2022.   3. Depression: Continue Zoloft as prescribed. 4.  Joint pain: Chronic and unchanged.  Unrelated to anastrozole or tamoxifen.   The entire visit was done in the presence of an interpreter.  Patient expressed understanding and was in agreement with this plan. She also understands that She can call clinic at any time with any questions, concerns, or complaints.   Cancer Staging Primary cancer of upper outer quadrant of left female breast [facility]) Staging form: Breast, AJCC 7th Edition - Clinical stage from 11/16/2015: Stage IA (T1b, N0, M0) - Signed by Lloyd Huger, MD on 11/17/2015   Lloyd Huger, MD   05/22/2019 3:44 PM

## 2019-05-21 ENCOUNTER — Other Ambulatory Visit: Payer: Self-pay

## 2019-05-21 ENCOUNTER — Inpatient Hospital Stay: Payer: Medicare Other | Attending: Oncology | Admitting: Oncology

## 2019-05-21 ENCOUNTER — Encounter: Payer: Self-pay | Admitting: Oncology

## 2019-05-21 VITALS — BP 156/88 | HR 97 | Temp 96.8°F | Wt 166.3 lb

## 2019-05-21 DIAGNOSIS — Z79811 Long term (current) use of aromatase inhibitors: Secondary | ICD-10-CM | POA: Insufficient documentation

## 2019-05-21 DIAGNOSIS — Z923 Personal history of irradiation: Secondary | ICD-10-CM | POA: Diagnosis not present

## 2019-05-21 DIAGNOSIS — K219 Gastro-esophageal reflux disease without esophagitis: Secondary | ICD-10-CM | POA: Diagnosis not present

## 2019-05-21 DIAGNOSIS — Z170 Estrogen receptor positive status [ER+]: Secondary | ICD-10-CM | POA: Insufficient documentation

## 2019-05-21 DIAGNOSIS — I10 Essential (primary) hypertension: Secondary | ICD-10-CM | POA: Diagnosis not present

## 2019-05-21 DIAGNOSIS — F329 Major depressive disorder, single episode, unspecified: Secondary | ICD-10-CM | POA: Insufficient documentation

## 2019-05-21 DIAGNOSIS — M810 Age-related osteoporosis without current pathological fracture: Secondary | ICD-10-CM | POA: Insufficient documentation

## 2019-05-21 DIAGNOSIS — Z79899 Other long term (current) drug therapy: Secondary | ICD-10-CM | POA: Insufficient documentation

## 2019-05-21 DIAGNOSIS — C50412 Malignant neoplasm of upper-outer quadrant of left female breast: Secondary | ICD-10-CM | POA: Diagnosis not present

## 2019-05-21 NOTE — Progress Notes (Signed)
Patient here for follow up. Pt feels like her bones are weaker and is scared that she might fall anytime. Pt had 1st dose of Covid vaccine and will have 2nd on march 5.

## 2019-07-07 ENCOUNTER — Other Ambulatory Visit: Payer: Self-pay | Admitting: Primary Care

## 2019-07-07 ENCOUNTER — Ambulatory Visit: Payer: Medicare Other | Attending: Primary Care

## 2019-07-07 DIAGNOSIS — I809 Phlebitis and thrombophlebitis of unspecified site: Secondary | ICD-10-CM

## 2019-07-21 ENCOUNTER — Other Ambulatory Visit (INDEPENDENT_AMBULATORY_CARE_PROVIDER_SITE_OTHER): Payer: Self-pay | Admitting: Nurse Practitioner

## 2019-07-21 DIAGNOSIS — I8390 Asymptomatic varicose veins of unspecified lower extremity: Secondary | ICD-10-CM

## 2019-07-29 ENCOUNTER — Encounter (INDEPENDENT_AMBULATORY_CARE_PROVIDER_SITE_OTHER): Payer: Self-pay | Admitting: Nurse Practitioner

## 2019-07-29 ENCOUNTER — Ambulatory Visit (INDEPENDENT_AMBULATORY_CARE_PROVIDER_SITE_OTHER): Payer: Medicare Other | Admitting: Nurse Practitioner

## 2019-07-29 ENCOUNTER — Ambulatory Visit (INDEPENDENT_AMBULATORY_CARE_PROVIDER_SITE_OTHER): Payer: Medicare Other

## 2019-07-29 ENCOUNTER — Other Ambulatory Visit: Payer: Self-pay

## 2019-07-29 VITALS — BP 181/94 | HR 80 | Resp 16 | Ht 60.0 in | Wt 170.8 lb

## 2019-07-29 DIAGNOSIS — I8311 Varicose veins of right lower extremity with inflammation: Secondary | ICD-10-CM

## 2019-07-29 DIAGNOSIS — I8390 Asymptomatic varicose veins of unspecified lower extremity: Secondary | ICD-10-CM | POA: Diagnosis not present

## 2019-07-29 DIAGNOSIS — M1712 Unilateral primary osteoarthritis, left knee: Secondary | ICD-10-CM | POA: Diagnosis not present

## 2019-07-29 DIAGNOSIS — I8312 Varicose veins of left lower extremity with inflammation: Secondary | ICD-10-CM

## 2019-07-29 MED ORDER — IBUPROFEN 800 MG PO TABS: 800.0000 mg | ORAL_TABLET | Freq: Three times a day (TID) | ORAL | 3 refills | Status: DC | PRN

## 2019-07-30 ENCOUNTER — Encounter (INDEPENDENT_AMBULATORY_CARE_PROVIDER_SITE_OTHER): Payer: Self-pay | Admitting: Nurse Practitioner

## 2019-07-30 NOTE — Progress Notes (Signed)
Subjective:    Patient ID: Jasmine Gardner, female    DOB: Apr 22, 1950, 69 y.o.   MRN: [id number] No chief complaint on file.   The patient is seen for evaluation of symptomatic varicose veins. The patient relates burning and stinging which worsened steadily throughout the course of the day, particularly with standing. The patient also notes an aching and throbbing pain over the varicosities, particularly with prolonged dependent positions. The symptoms are significantly improved with elevation.  The patient also notes that during hot weather the symptoms are greatly intensified. The patient states the pain from the varicose veins interferes with work, daily exercise, shopping and household maintenance. At this point, the symptoms are persistent and severe enough that they're having a negative impact on lifestyle and are interfering with daily activities.  There is no history of DVT, PE or superficial thrombophlebitis. There is no history of ulceration or hemorrhage. The patient denies a significant family history of varicose veins.   The patient has not worn graduated compression in the past. At the present time the patient has not been using over-the-counter analgesics. There is no history of prior surgical intervention or sclerotherapy.  Today the patient underwent noninvasive studies.  There is no evidence of DVT or superficial venous thrombosis bilaterally.  The patient has an evidence of deep venous insufficiency in the bilateral common femoral veins as well as popliteal veins.  The patient also has evidence of reflux in the great saphenous vein at the saphenofemoral junction bilaterally.   Review of Systems  Cardiovascular:       Pain over varicose veins  Musculoskeletal: Positive for arthralgias.  All other systems reviewed and are negative.      Objective:   Physical Exam Vitals reviewed. Exam conducted with a chaperone present (Interpreter present).  Constitutional:    Appearance: Normal appearance.  Cardiovascular:     Rate and Rhythm: Normal rate and regular rhythm.     Pulses: Normal pulses.     Heart sounds: Normal heart sounds.     Comments: Scattered varicosities bilaterally Pulmonary:     Effort: Pulmonary effort is normal.     Breath sounds: Normal breath sounds.  Musculoskeletal:        General: Tenderness present.  Skin:    Capillary Refill: Capillary refill takes less than 2 seconds.  Neurological:     Mental Status: She is alert and oriented to person, place, and time.  Psychiatric:        Mood and Affect: Mood normal.        Behavior: Behavior normal.        Thought Content: Thought content normal.        Judgment: Judgment normal.     BP (!) 181/94 (BP Location: Right Arm)   Pulse 80   Resp 16   Ht 5' (1.524 m)   Wt 170 lb 12.8 oz (77.5 kg)   BMI 33.36 kg/m   Past Medical History:  Diagnosis Date  . Arthritis   . Cancer ([place])    breast  . Depression   . GERD (gastroesophageal reflux disease)   . Headache   . Hypertension   . Personal history of radiation therapy   . Post-menopausal   . UTI (urinary tract infection)     Social History   Socioeconomic History  . Marital status: Single    Spouse name: Not on file  . Number of children: Not on file  . Years of education: Not on file  .  Highest education level: Not on file  Occupational History  . Not on file  Tobacco Use  . Smoking status: Never Smoker  . Smokeless tobacco: Never Used  Substance and Sexual Activity  . Alcohol use: No  . Drug use: No  . Sexual activity: Not on file  Other Topics Concern  . Not on file  Social History Narrative  . Not on file   Social Determinants of Health   Financial Resource Strain:   . Difficulty of Paying Living Expenses:   Food Insecurity:   . Worried About Charity fundraiser in the Last Year:   . Arboriculturist in the Last Year:   Transportation Needs:   . Film/video editor (Medical):   [name] Lack of  Transportation (Non-Medical):   Physical Activity:   . Days of Exercise per Week:   . Minutes of Exercise per Session:   Stress:   . Feeling of Stress :   Social Connections:   . Frequency of Communication with Friends and Family:   . Frequency of Social Gatherings with Friends and Family:   . Attends Religious Services:   . Active Member of Clubs or Organizations:   . Attends Archivist Meetings:   [name] Marital Status:   Intimate Partner Violence:   . Fear of Current or Ex-Partner:   . Emotionally Abused:   [name] Physically Abused:   . Sexually Abused:     Past Surgical History:  Procedure Laterality Date  . BREAST BIOPSY Left 11/08/2016   bx +  . BREAST EXCISIONAL BIOPSY Left 11/24/2016   lumpectomy with rad  . BREAST LUMPECTOMY Left 11/24/2016  . COLONOSCOPY  2007  . PARTIAL MASTECTOMY WITH NEEDLE LOCALIZATION Left 11/25/2015   Procedure: PARTIAL MASTECTOMY WITH NEEDLE LOCALIZATION;  Surgeon: Leonie Green, MD;  Location: [facility] ORS;  Service: General;  Laterality: Left;  . SENTINEL NODE BIOPSY Left 11/25/2015   Procedure: SENTINEL NODE BIOPSY;  Surgeon: Leonie Green, MD;  Location: [facility] OR[facility];  Service: General;  Laterality: Left;  . TUBAL LIGATION      Family History  Problem Relation Age of Onset  . Bladder Cancer Neg Hx   . Kidney cancer Neg Hx     No Known Allergies     Assessment & Plan:   1. Varicose veins of both lower extremities with inflammation Recommend:  The patient is complaining of varicose veins.    I have had a long discussion with the patient regarding  varicose veins and why they cause symptoms.  Patient will begin wearing graduated compression stockings on a daily basis, beginning first thing in the morning and removing them in the evening. The patient is instructed specifically not to sleep in the stockings.    The patient  will also begin using over-the-counter analgesics such as Motrin 600 mg po TID to help control the symptoms  as needed.    In addition, behavioral modification including elevation during the day will be initiated, utilizing a recliner was recommended.  The patient is also instructed to continue exercising such as walking 4-5 times per week.  At this time the patient wishes to continue conservative therapy and is not interested in more invasive treatments such as laser ablation and sclerotherapy.  The Patient will follow up PRN if the symptoms worsen. - ibuprofen (ADVIL) 800 MG tablet; Take 1 tablet (800 mg total) by mouth every 8 (eight) hours as needed.  Dispense: 30 tablet; Refill: 3  2. Primary  osteoarthritis of left knee Continue NSAID medications as already ordered, these medications have been reviewed and there are no changes at this time.  Continued activity and therapy was stressed.    Current Outpatient Medications on File Prior to Visit  Medication Sig Dispense Refill  . alendronate (FOSAMAX) 70 MG tablet Take 1 tablet (70 mg total) by mouth once a week. Take with a full glass of water on an empty stomach. 12 tablet 3  . amLODipine (NORVASC) 5 MG tablet Take 5 mg by mouth daily.    . bisoprolol-hydrochlorothiazide (ZIAC) 10-6.25 MG tablet Take 1 tablet by mouth 2 (two) times daily.    . Calcium Carb-Cholecalciferol 500-400 MG-UNIT TABS Take by mouth.    . estradiol (ESTRACE VAGINAL) 0.1 MG/GM vaginal cream Place vaginally.    . hydrOXYzine (VISTARIL) 25 MG capsule Take 25 mg by mouth as needed.    [name] lisinopril (PRINIVIL,ZESTRIL) 40 MG tablet Take 40 mg by mouth at bedtime.    [name] losartan (COZAAR) 100 MG tablet Take 100 mg by mouth daily.    [name] omeprazole (PRILOSEC) 20 MG capsule Take 20 mg by mouth daily.     . pravastatin (PRAVACHOL) 20 MG tablet Take 20 mg by mouth at bedtime.    . sertraline (ZOLOFT) 25 MG tablet Take 25 mg by mouth daily.    . tamoxifen (NOLVADEX) 20 MG tablet Take 1 tablet (20 mg total) by mouth daily. 30 tablet 11   No current facility-administered medications  on file prior to visit.    There are no Patient Instructions on file for this visit. No follow-ups on file.   Kris Hartmann, NP

## 2019-09-09 ENCOUNTER — Other Ambulatory Visit: Payer: Self-pay | Admitting: *Deleted

## 2019-09-09 MED ORDER — TAMOXIFEN CITRATE 20 MG PO TABS: 20.0000 mg | ORAL_TABLET | Freq: Every day | ORAL | 11 refills | Status: DC

## 2019-11-12 NOTE — Progress Notes (Signed)
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 Low risk Oncotype Dx of 15.  INTERVAL HISTORY: Patient returns to clinic for routine 66-monthevaluation.  Has persistent worsening bilateral knee pain.  Currently taking meloxicam 15 mg daily which is not appear to be covering her for the whole day.  Was given Flexeril 10 mg tablets which is too strong for her.  Otherwise she feels well. She has no neurologic complaints. She denies any recent fevers or illnesses. She has a good appetite and denies weight loss.  She denies any chest pain, shortness of breath, cough, or hemoptysis.  She denies any nausea, vomiting, constipation, or diarrhea. She has no urinary complaints.  Patient offers no further specific complaints today.  REVIEW OF SYSTEMS:   Review of Systems  Constitutional: Negative.  Negative for fever, malaise/fatigue and weight loss.  Respiratory: Negative.  Negative for cough and shortness of breath.   Cardiovascular: Negative.  Negative for chest pain and leg swelling.  Gastrointestinal: Negative.  Negative for abdominal pain.  Genitourinary: Negative.  Negative for dysuria.  Musculoskeletal: Positive for joint pain. Negative for myalgias and neck pain.  Skin: Negative.  Negative for rash.  Neurological: Negative.  Negative for sensory change, focal weakness, weakness and headaches.  Psychiatric/Behavioral: Negative.  Negative for depression. The patient is not nervous/anxious.     As per HPI. Otherwise, a complete review of systems is negative.  PAST MEDICAL HISTORY: Past Medical History:  Diagnosis Date  . Arthritis   . [place]    breast  . Depression   . GERD (gastroesophageal  reflux disease)   . Headache   . Hypertension   . Personal history of radiation therapy   . Post-menopausal   . UTI (urinary tract infection)     PAST SURGICAL HISTORY: Past Surgical History:  Procedure Laterality Date  . BREAST BIOPSY Left 11/08/2016   bx +  . BREAST EXCISIONAL BIOPSY Left 11/24/2016   lumpectomy with rad  . BREAST LUMPECTOMY Left 11/24/2016  . COLONOSCOPY  2007  . PARTIAL MASTECTOMY WITH NEEDLE LOCALIZATION Left 11/25/2015   Procedure: PARTIAL MASTECTOMY WITH NEEDLE LOCALIZATION;  Surgeon: JLeonie Green MD;  Location: [facility] ORS;  Service: General;  Laterality: Left;  . SENTINEL NODE BIOPSY Left 11/25/2015   Procedure: SENTINEL NODE BIOPSY;  Surgeon: JLeonie Green MD;  Location: [facility] OR[facility];  Service: General;  Laterality: Left;  . TUBAL LIGATION      FAMILY HISTORY: Reviewed and unchanged. No reported history of malignancy or chronic disease.     ADVANCED DIRECTIVES (Y/N):  N   HEALTH MAINTENANCE: Social History   Tobacco Use  . Smoking status: Never Smoker  . Smokeless tobacco: Never Used  Substance Use Topics  . Alcohol use: No  . Drug use: No     Colonoscopy:  PAP:  Bone density:  Lipid panel:  No Known Allergies  Current Outpatient Medications  Medication Sig Dispense Refill  . alendronate (FOSAMAX) 70 MG tablet Take 1 tablet (70 mg total) by mouth once a week. Take with a full glass of water on an empty stomach. 12 tablet 3  . amLODipine (NORVASC) 5 MG tablet Take 5 mg by mouth daily.    . bisoprolol-hydrochlorothiazide ([facility] 10-6.25  MG tablet Take 1 tablet by mouth 2 (two) times daily.    . Calcium Carb-Cholecalciferol 500-400 MG-UNIT TABS Take by mouth.    . estradiol (ESTRACE VAGINAL) 0.1 MG/GM vaginal cream Place vaginally.    . hydrOXYzine (VISTARIL) 25 MG capsule Take 25 mg by mouth as needed.    [name] ibuprofen (ADVIL) 800 MG tablet Take 1 tablet (800 mg total) by mouth every 8 (eight) hours as needed. 30 tablet 3  .  lisinopril (PRINIVIL,ZESTRIL) 40 MG tablet Take 40 mg by mouth at bedtime.    [name] losartan (COZAAR) 100 MG tablet Take 100 mg by mouth daily.    [name] omeprazole (PRILOSEC) 20 MG capsule Take 20 mg by mouth daily.     . pravastatin (PRAVACHOL) 20 MG tablet Take 20 mg by mouth at bedtime.    . sertraline (ZOLOFT) 25 MG tablet Take 25 mg by mouth daily.    . tamoxifen (NOLVADEX) 20 MG tablet Take 1 tablet (20 mg total) by mouth daily. 30 tablet 11   No current facility-administered medications for this visit.    OBJECTIVE: There were no vitals filed for this visit.   There is no height or weight on file to calculate BMI.    ECOG FS:0 - Asymptomatic  Physical Exam Constitutional:      Appearance: Normal appearance.  HENT:     Head: Normocephalic and atraumatic.  Eyes:     Pupils: Pupils are equal, round, and reactive to light.  Cardiovascular:     Rate and Rhythm: Normal rate and regular rhythm.     Heart sounds: Normal heart sounds. No murmur heard.   Pulmonary:     Effort: Pulmonary effort is normal.     Breath sounds: Normal breath sounds. No wheezing.  Abdominal:     General: Bowel sounds are normal. There is no distension.     Palpations: Abdomen is soft.     Tenderness: There is no abdominal tenderness.  Musculoskeletal:        General: Tenderness (Bilateral knee pain) present. Normal range of motion.     Cervical back: Normal range of motion.  Skin:    General: Skin is warm and dry.     Findings: No rash.  Neurological:     Mental Status: She is alert and oriented to person, place, and time.  Psychiatric:        Judgment: Judgment normal.      LAB RESULTS:  Lab Results  Component Value Date   NA 134 (L) 06/24/2016   K 4.5 06/24/2016   CL 100 (L) 06/24/2016   CO2 27 06/24/2016   GLUCOSE 103 (H) 06/24/2016   BUN <5 (L) 06/24/2016   CREATININE 0.52 06/24/2016   CALCIUM 9.6 06/24/2016   GFRNONAA >60 06/24/2016   GFRAA >60 06/24/2016    Lab Results  Component  Value Date   WBC 5.5 02/14/2016   HGB 12.6 02/14/2016   HCT 37.5 02/14/2016   MCV 83.1 02/14/2016   PLT 198 02/14/2016     STUDIES: No results found.  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 Low risk Oncotype DX and 15.  PLAN:    1. 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  She also completed adjuvant XRT.  Given her low risk Oncotype, she did not require adjuvant chemotherapy. Patient could  not tolerate letrozole, therefore was switched to anastrozole.  Because of her osteoporosis, anastrozole was discontinued and she was given a prescription for tamoxifen.  Complete treatment in June 2023.  Her most recent mammogram on February 13, 2019 was reported BI-RADS 2.  Repeat in November 2021.  Return to clinic in 6 months for routine evaluation. 2. Osteoporosis: Patient's most recent bone mineral density on April 23, 2019 revealed a T score of -3.1 which is essentially unchanged from 1 year prior when the T score was reported -3.0.  Continue Fosamax, calcium, and vitamin D.  Repeat in January 2022.   3. Depression: Continue Zoloft as prescribed. 4.  Joint pain: Appears worse over the past few months.  I have asked her to hold her tamoxifen x2 weeks to see if joint pain improves.  I have asked her to stop Flexeril because it makes her too sedated.  Can try Robaxin 500 mg twice a day as needed.  Have also asked that she add Tylenol with her 15 mg meloxicam to see if this helps her joint pain.  The entire visit was done in the presence of an interpreter.  Patient expressed understanding and was in agreement with this plan. She also understands that She can call clinic at any time with any questions, concerns, or complaints.   Cancer Staging Primary cancer of upper outer quadrant of left  female breast [facility]) Staging form: Breast, AJCC 7th Edition - Clinical stage from 11/16/2015: Stage IA (T1b, N0, M0) - Signed by Lloyd Huger, MD on 11/17/2015   Faythe Casa, NP 11/19/2019 3:50 PM

## 2019-11-19 ENCOUNTER — Inpatient Hospital Stay: Payer: Medicare Other | Attending: Oncology | Admitting: Oncology

## 2019-11-19 ENCOUNTER — Other Ambulatory Visit: Payer: Self-pay

## 2019-11-19 VITALS — BP 140/79 | HR 80 | Temp 97.6°F | Resp 16 | Wt 167.7 lb

## 2019-11-19 DIAGNOSIS — M810 Age-related osteoporosis without current pathological fracture: Secondary | ICD-10-CM

## 2019-11-19 DIAGNOSIS — Z170 Estrogen receptor positive status [ER+]: Secondary | ICD-10-CM | POA: Insufficient documentation

## 2019-11-19 DIAGNOSIS — Z79811 Long term (current) use of aromatase inhibitors: Secondary | ICD-10-CM | POA: Insufficient documentation

## 2019-11-19 DIAGNOSIS — C50412 Malignant neoplasm of upper-outer quadrant of left female breast: Secondary | ICD-10-CM

## 2019-11-19 MED ORDER — METHOCARBAMOL 500 MG PO TABS: 500.0000 mg | ORAL_TABLET | Freq: Two times a day (BID) | ORAL | 0 refills | Status: DC

## 2019-11-19 NOTE — Patient Instructions (Signed)
Stop tamoxifen x2 weeks.  Stop Flexeril because it is making you sleepy. Can try robaxin for your muscle/joint pain. RX sent to pharmacy.   Continue meloxicam 15 mg daily.  Can add Tylenol 500 mg to your meloxicam each morning to see if this helps with your joint pain..   If your joint pain improves after stopping your tamoxifen, we can transition you to a different medication for the rest of your 5 years.   We will get you scheduled for your mammogram in November 2021.  Faythe Casa, NP 11/19/2019 3:26 PM

## 2019-11-19 NOTE — Progress Notes (Signed)
Reports general joint pains in bil knees and bil shoulders.Has always has tenderness in Left outer breast area. Good appetite. Pt signed waiver to have her grandsonson interpret for her today.

## 2019-12-08 ENCOUNTER — Other Ambulatory Visit: Payer: Self-pay

## 2019-12-08 ENCOUNTER — Inpatient Hospital Stay: Payer: Medicare Other | Admitting: Oncology

## 2020-05-05 ENCOUNTER — Other Ambulatory Visit: Payer: Self-pay | Admitting: Primary Care

## 2020-05-05 DIAGNOSIS — M810 Age-related osteoporosis without current pathological fracture: Secondary | ICD-10-CM

## 2020-05-05 DIAGNOSIS — Z1231 Encounter for screening mammogram for malignant neoplasm of breast: Secondary | ICD-10-CM

## 2020-05-21 NOTE — Progress Notes (Deleted)
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 Low risk Oncotype Dx of 15.  INTERVAL HISTORY: Patient returns to clinic today for routine 52-month evaluation.  She has chronic bilateral knee pain, but otherwise feels well.  She continues to tolerate tamoxifen and Fosamax without significant side effects. She has no neurologic complaints. She denies any recent fevers or illnesses. She has a good appetite and denies weight loss.  She denies any chest pain, shortness of breath, cough, or hemoptysis.  She denies any nausea, vomiting, constipation, or diarrhea. She has no urinary complaints.  Patient offers no further specific complaints today.  REVIEW OF SYSTEMS:   Review of Systems  Constitutional: Negative.  Negative for fever, malaise/fatigue and weight loss.  Respiratory: Negative.  Negative for cough and shortness of breath.   Cardiovascular: Negative.  Negative for chest pain and leg swelling.  Gastrointestinal: Negative.  Negative for abdominal pain.  Genitourinary: Negative.  Negative for dysuria.  Musculoskeletal: Positive for joint pain. Negative for myalgias and neck pain.  Skin: Negative.  Negative for rash.  Neurological: Negative.  Negative for sensory change, focal weakness, weakness and headaches.  Psychiatric/Behavioral: Negative.  Negative for depression. The patient is not nervous/anxious.     As per HPI. Otherwise, a complete review of systems is negative.  PAST MEDICAL HISTORY: Past Medical History:  Diagnosis Date  . Arthritis   . Cancer ([place])    breast  . Depression   . GERD (gastroesophageal reflux disease)   . Headache   . Hypertension   . Personal history of radiation  therapy   . Post-menopausal   . UTI (urinary tract infection)     PAST SURGICAL HISTORY: Past Surgical History:  Procedure Laterality Date  . BREAST BIOPSY Left 11/08/2016   bx +  . BREAST EXCISIONAL BIOPSY Left 11/24/2016   lumpectomy with rad  . BREAST LUMPECTOMY Left 11/24/2016  . COLONOSCOPY  2007  . PARTIAL MASTECTOMY WITH NEEDLE LOCALIZATION Left 11/25/2015   Procedure: PARTIAL MASTECTOMY WITH NEEDLE LOCALIZATION;  Surgeon: Leonie Green, MD;  Location: [facility] ORS;  Service: General;  Laterality: Left;  . SENTINEL NODE BIOPSY Left 11/25/2015   Procedure: SENTINEL NODE BIOPSY;  Surgeon: Leonie Green, MD;  Location: [facility] ORS;  Service: General;  Laterality: Left;  . TUBAL LIGATION      FAMILY HISTORY: Reviewed and unchanged. No reported history of malignancy or chronic disease.     ADVANCED DIRECTIVES (Y/N):  N   HEALTH MAINTENANCE: Social History   Tobacco Use  . Smoking status: Never Smoker  . Smokeless tobacco: Never Used  Substance Use Topics  . Alcohol use: No  . Drug use: No     Colonoscopy:  PAP:  Bone density:  Lipid panel:  No Known Allergies  Current Outpatient Medications  Medication Sig Dispense Refill  . alendronate (FOSAMAX) 70 MG tablet Take 1 tablet (70 mg total) by mouth once a week. Take with a full glass of water on an empty stomach. 12 tablet 3  . amLODipine (NORVASC) 5 MG tablet Take 5 mg by mouth daily.    . bisoprolol-hydrochlorothiazide (ZIAC) 10-6.25 MG tablet Take 1 tablet by mouth 2 (two) times daily.    . Calcium Carb-Cholecalciferol 500-400 MG-UNIT TABS  Take by mouth. (Patient not taking: Reported on 11/19/2019)    . estradiol (ESTRACE VAGINAL) 0.1 MG/GM vaginal cream Place vaginally. (Patient not taking: Reported on 11/19/2019)    . hydrOXYzine (VISTARIL) 25 MG capsule Take 25 mg by mouth as needed. (Patient not taking: Reported on 11/19/2019)    . ibuprofen (ADVIL) 800 MG tablet Take 1 tablet (800 mg total) by mouth every 8  (eight) hours as needed. 30 tablet 3  . lisinopril (PRINIVIL,ZESTRIL) 40 MG tablet Take 40 mg by mouth at bedtime.    [name] losartan (COZAAR) 100 MG tablet Take 100 mg by mouth daily.    . methocarbamol (ROBAXIN) 500 MG tablet Take 1 tablet (500 mg total) by mouth in the morning and at bedtime. 90 tablet 0  . omeprazole (PRILOSEC) 20 MG capsule Take 20 mg by mouth daily.     . pravastatin (PRAVACHOL) 20 MG tablet Take 20 mg by mouth at bedtime.    . sertraline (ZOLOFT) 25 MG tablet Take 25 mg by mouth daily.    . tamoxifen (NOLVADEX) 20 MG tablet Take 1 tablet (20 mg total) by mouth daily. 30 tablet 11   No current facility-administered medications for this visit.    OBJECTIVE: There were no vitals filed for this visit.   There is no height or weight on file to calculate BMI.    ECOG FS:0 - Asymptomatic  General: Well-developed, well-nourished, no acute distress. Eyes: Pink conjunctiva, anicteric sclera. HEENT: Normocephalic, moist mucous membranes. Breast: Exam deferred today. Lungs: No audible wheezing or coughing. Heart: Regular rate and rhythm. Abdomen: Soft, nontender, no obvious distention. Musculoskeletal: No edema, cyanosis, or clubbing. Neuro: Alert, answering all questions appropriately. Cranial nerves grossly intact. Skin: No rashes or petechiae noted. Psych: Normal affect.   LAB RESULTS:  Lab Results  Component Value Date   NA 134 (L) 06/24/2016   K 4.5 06/24/2016   CL 100 (L) 06/24/2016   CO2 27 06/24/2016   GLUCOSE 103 (H) 06/24/2016   BUN <5 (L) 06/24/2016   CREATININE 0.52 06/24/2016   CALCIUM 9.6 06/24/2016   GFRNONAA >60 06/24/2016   GFRAA >60 06/24/2016    Lab Results  Component Value Date   WBC 5.5 02/14/2016   HGB 12.6 02/14/2016   HCT 37.5 02/14/2016   MCV 83.1 02/14/2016   PLT 198 02/14/2016     STUDIES: No results found.  ASSESSMENT: Clinical stage Ia ER/PR positive, HER-2/neu not overexpressing adenocarcinoma of the left upper outer  quadrant of the left breast. Low risk Oncotype DX and 15.  PLAN:    1. 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  She also completed adjuvant XRT.  Given her low risk Oncotype, she did not require adjuvant chemotherapy. Patient could not tolerate letrozole, therefore was switched to anastrozole.  Because of her osteoporosis, anastrozole was discontinued and she was given a prescription for tamoxifen.  Complete treatment in June 2023.  Her most recent mammogram on February 13, 2019 was reported BI-RADS 2.  Repeat in November 2021.  Return to clinic in 6 months for routine evaluation. 2. Osteoporosis: Patient's most recent bone mineral density on April 23, 2019 revealed a T score of -3.1 which is essentially unchanged from 1 year prior when the T score was reported -3.0.  Continue Fosamax, calcium, and vitamin D.  Repeat in January 2022.   3. Depression: Continue Zoloft as prescribed. 4.  Joint  pain: Chronic and unchanged.  Unrelated to anastrozole or tamoxifen.   The entire visit was done in the presence of an interpreter.  Patient expressed understanding and was in agreement with this plan. She also understands that She can call clinic at any time with any questions, concerns, or complaints.   Cancer Staging Primary cancer of upper outer quadrant of left female breast [facility]) Staging form: Breast, AJCC 7th Edition - Clinical stage from 11/16/2015: Stage IA (T1b, N0, M0) - Signed by Lloyd Huger, MD on 11/17/2015 Laterality: Left Estrogen receptor status: Positive Progesterone receptor status: Positive HER2 status: Negative   Lloyd Huger, MD   05/21/2020 8:43 AM

## 2020-05-26 ENCOUNTER — Inpatient Hospital Stay: Payer: Medicare Other | Admitting: Oncology

## 2020-05-26 DIAGNOSIS — C50412 Malignant neoplasm of upper-outer quadrant of left female breast: Secondary | ICD-10-CM

## 2020-07-25 ENCOUNTER — Other Ambulatory Visit: Payer: Medicare Other

## 2020-09-29 ENCOUNTER — Other Ambulatory Visit: Payer: Self-pay | Admitting: Oncology

## 2020-12-15 ENCOUNTER — Ambulatory Visit
Admission: RE | Admit: 2020-12-15 | Discharge: 2020-12-15 | Disposition: A | Discharge status: Home / Self Care | Payer: Medicare Other | Source: Ambulatory Visit | Attending: Primary Care | Admitting: Primary Care

## 2020-12-15 ENCOUNTER — Other Ambulatory Visit: Payer: Self-pay

## 2020-12-15 DIAGNOSIS — M810 Age-related osteoporosis without current pathological fracture: Secondary | ICD-10-CM | POA: Insufficient documentation

## 2020-12-15 DIAGNOSIS — Z1231 Encounter for screening mammogram for malignant neoplasm of breast: Secondary | ICD-10-CM | POA: Insufficient documentation

## 2020-12-19 ENCOUNTER — Telehealth: Payer: Self-pay

## 2020-12-19 ENCOUNTER — Other Ambulatory Visit: Payer: Self-pay

## 2020-12-19 DIAGNOSIS — Z1211 Encounter for screening for malignant neoplasm of colon: Secondary | ICD-10-CM

## 2020-12-19 MED ORDER — GOLYTELY 236 G PO SOLR: 4000.0000 mL | Freq: Once | ORAL | 0 refills | Status: AC

## 2020-12-19 NOTE — Telephone Encounter (Signed)
Gastroenterology Pre-Procedure Review  Request Date: 01/06/2021 Requesting Physician: Dr. Bonna Gains   PATIENT REVIEW QUESTIONS: The patient responded to the following health history questions as indicated:    1. Are you having any GI issues? no 2. Do you have a personal history of Polyps? no 3. Do you have a family history of Colon Cancer or Polyps? no 4. Diabetes Mellitus? no 5. Joint replacements in the past 12 months?no 6. Major health problems in the past 3 months?no 7. Any artificial heart valves, MVP, or defibrillator?no    MEDICATIONS & ALLERGIES:    Patient reports the following regarding taking any anticoagulation/antiplatelet therapy:   Plavix, Coumadin, Eliquis, Xarelto, Lovenox, Pradaxa, Brilinta, or Effient? no Aspirin? no  Patient confirms/reports the following medications:  Current Outpatient Medications  Medication Sig Dispense Refill   alendronate (FOSAMAX) 70 MG tablet Take 1 tablet (70 mg total) by mouth once a week. Take with a full glass of water on an empty stomach. 12 tablet 3   amLODipine (NORVASC) 5 MG tablet Take 5 mg by mouth daily.     bisoprolol-hydrochlorothiazide (ZIAC) 10-6.25 MG tablet Take 1 tablet by mouth 2 (two) times daily.     Calcium Carb-Cholecalciferol 500-400 MG-UNIT TABS Take by mouth. (Patient not taking: Reported on 11/19/2019)     estradiol (ESTRACE VAGINAL) 0.1 MG/GM vaginal cream Place vaginally. (Patient not taking: Reported on 11/19/2019)     hydrOXYzine (VISTARIL) 25 MG capsule Take 25 mg by mouth as needed. (Patient not taking: Reported on 11/19/2019)     ibuprofen (ADVIL) 800 MG tablet Take 1 tablet (800 mg total) by mouth every 8 (eight) hours as needed. 30 tablet 3   lisinopril (PRINIVIL,ZESTRIL) 40 MG tablet Take 40 mg by mouth at bedtime.     losartan (COZAAR) 100 MG tablet Take 100 mg by mouth daily.     methocarbamol (ROBAXIN) 500 MG tablet Take 1 tablet (500 mg total) by mouth in the morning and at bedtime. 90 tablet 0    omeprazole (PRILOSEC) 20 MG capsule Take 20 mg by mouth daily.      pravastatin (PRAVACHOL) 20 MG tablet Take 20 mg by mouth at bedtime.     sertraline (ZOLOFT) 25 MG tablet Take 25 mg by mouth daily.     tamoxifen (NOLVADEX) 20 MG tablet TAKE 1 TABLET BY MOUTH DAILY. 30 tablet 0   No current facility-administered medications for this visit.    Patient confirms/reports the following allergies:  No Known Allergies  No orders of the defined types were placed in this encounter.   AUTHORIZATION INFORMATION Primary Insurance: 1D#: Group #:  Secondary Insurance: 1D#: Group #:  SCHEDULE INFORMATION: Date:  Time: Location:

## 2021-01-05 ENCOUNTER — Encounter: Payer: Self-pay | Admitting: Gastroenterology

## 2021-01-06 ENCOUNTER — Ambulatory Visit: Payer: Medicare Other | Admitting: Anesthesiology

## 2021-01-06 ENCOUNTER — Encounter
Admission: RE | Disposition: A | Discharge status: Home / Self Care | Payer: Self-pay | Source: Home / Self Care | Attending: Gastroenterology

## 2021-01-06 ENCOUNTER — Ambulatory Visit
Admission: RE | Admit: 2021-01-06 | Discharge: 2021-01-06 | Disposition: A | Discharge status: Home / Self Care | Payer: Medicare Other | Attending: Gastroenterology | Admitting: Gastroenterology

## 2021-01-06 DIAGNOSIS — E669 Obesity, unspecified: Secondary | ICD-10-CM | POA: Insufficient documentation

## 2021-01-06 DIAGNOSIS — Z1211 Encounter for screening for malignant neoplasm of colon: Secondary | ICD-10-CM

## 2021-01-06 DIAGNOSIS — K219 Gastro-esophageal reflux disease without esophagitis: Secondary | ICD-10-CM | POA: Insufficient documentation

## 2021-01-06 DIAGNOSIS — Z79899 Other long term (current) drug therapy: Secondary | ICD-10-CM | POA: Insufficient documentation

## 2021-01-06 DIAGNOSIS — Z6833 Body mass index (BMI) 33.0-33.9, adult: Secondary | ICD-10-CM | POA: Diagnosis not present

## 2021-01-06 DIAGNOSIS — K648 Other hemorrhoids: Secondary | ICD-10-CM | POA: Insufficient documentation

## 2021-01-06 DIAGNOSIS — I10 Essential (primary) hypertension: Secondary | ICD-10-CM | POA: Insufficient documentation

## 2021-01-06 DIAGNOSIS — K6389 Other specified diseases of intestine: Secondary | ICD-10-CM

## 2021-01-06 DIAGNOSIS — Z853 Personal history of malignant neoplasm of breast: Secondary | ICD-10-CM | POA: Diagnosis not present

## 2021-01-06 HISTORY — PX: COLONOSCOPY WITH PROPOFOL: SHX5780

## 2021-01-06 SURGERY — COLONOSCOPY WITH PROPOFOL
Anesthesia: General

## 2021-01-06 MED ORDER — LIDOCAINE HCL (CARDIAC) PF 100 MG/5ML IV SOSY
PREFILLED_SYRINGE | INTRAVENOUS | Status: DC | PRN
  Administered 2021-01-06 (×2): 50 mg via INTRAVENOUS

## 2021-01-06 MED ORDER — PROPOFOL 10 MG/ML IV BOLUS
INTRAVENOUS | Status: DC | PRN
Start: 2021-01-06 — End: 2021-01-06
  Administered 2021-01-06: 50 mg via INTRAVENOUS
  Administered 2021-01-06: 20 mg via INTRAVENOUS
  Administered 2021-01-06: 50 mg via INTRAVENOUS

## 2021-01-06 MED ORDER — PROPOFOL 500 MG/50ML IV EMUL
INTRAVENOUS | Status: DC | PRN
  Administered 2021-01-06: 145 ug/kg/min via INTRAVENOUS

## 2021-01-06 MED ORDER — SODIUM CHLORIDE 0.9 % IV SOLN
INTRAVENOUS | Status: DC
  Administered 2021-01-06: 20 mL/h via INTRAVENOUS

## 2021-01-06 NOTE — Op Note (Signed)
[facility] Gastroenterology Patient Name: Leylanie Woodmansee Procedure Date: 01/06/2021 11:21 AM MRN: [id number] Account #: [id number] Date of Birth: [birth year] Admit Type: Outpatient Age: 70 Room: [facility] ENDO ROOM 2 Gender: Female Note Status: Finalized Instrument Name: Jasper Riling [id number] Procedure:             Colonoscopy Indications:           Screening for colorectal malignant neoplasm Providers:             Naomia Lenderman B. Bonna Gains MD, MD Referring MD:          Neomia Dear (Referring MD) Medicines:             Monitored Anesthesia Care Complications:         No immediate complications. Procedure:             Pre-Anesthesia Assessment:                        - Prior to the procedure, a History and Physical was                         performed, and patient medications, allergies and                         sensitivities were reviewed. The patient's tolerance                         of previous anesthesia was reviewed.                        - The risks and benefits of the procedure and the                         sedation options and risks were discussed with the                         patient. All questions were answered and informed                         consent was obtained.                        - Patient identification and proposed procedure were                         verified prior to the procedure by the physician, the                         nurse, the anesthesiologist, the anesthetist and the                         technician. The procedure was verified in the                         pre-procedure area in the procedure room in the                         endoscopy suite.                        -  Prophylactic Antibiotics: The patient does not                         require prophylactic antibiotics.                        - ASA Grade Assessment: II - A patient with mild                         systemic disease.                        - After  reviewing the risks and benefits, the patient                         was deemed in satisfactory condition to undergo the                         procedure.                        - Monitored anesthesia care was determined to be                         medically necessary for this procedure based on review                         of the patient's medical history, medications, and                         prior anesthesia history.                        - The anesthesia plan was to use monitored anesthesia                         care (MAC).                        After obtaining informed consent, the colonoscope was                         passed under direct vision. Throughout the procedure,                         the patient's blood pressure, pulse, and oxygen                         saturations were monitored continuously. The                         Colonoscope was introduced through the anus and                         advanced to the the cecum, identified by appendiceal                         orifice and ileocecal valve. The colonoscopy was  performed with ease. The patient tolerated the                         procedure well. The quality of the bowel preparation                         was poor. Findings:      The perianal and digital rectal examinations were normal.      A patchy area of moderate melanosis was found in the descending colon,       in the transverse colon and in the ascending colon. Biopsies were taken       with a cold forceps for histology.      A large amount of stool was found in the entire colon, interfering with       visualization.      The exam was otherwise without abnormality.      Non-bleeding internal hemorrhoids were found during retroflexion. Impression:            - Preparation of the colon was poor.                        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                        - Stool in the entire examined colon.                         - Non-bleeding internal hemorrhoids. Recommendation:        - Await pathology results.                        - Repeat colonoscopy within 3 months, with 2 day prep,                         because the bowel preparation was poor.                        - High fiber diet.                        - Continue present medications.                        - Return to primary care physician as previously                         scheduled.                        - The findings and recommendations were discussed with                         the patient.                        - The findings and recommendations were discussed with                         the patient's family. Procedure Code(s):     --- Professional ---  45380, Colonoscopy, flexible; with biopsy, single or                         multiple Diagnosis Code(s):     --- Professional ---                        Z12.11, Encounter for screening for malignant neoplasm                         of colon                        K63.89, Other specified diseases of intestine CPT copyright 2019 American Medical Association. All rights reserved. The codes documented in this report are preliminary and upon coder review may  be revised to meet current compliance requirements.  [name], MD Margretta Sidle B. Bonna Gains MD, MD 01/06/2021 12:01:52 PM This report has been signed electronically. Number of Addenda: 0 Note Initiated On: 01/06/2021 11:21 AM Scope Withdrawal Time: 0 hours 5 minutes 5 seconds  Total Procedure Duration: 0 hours 9 minutes 45 seconds  Estimated Blood Loss:  Estimated blood loss: none.      [facility]

## 2021-01-06 NOTE — Anesthesia Postprocedure Evaluation (Signed)
Anesthesia Post Note  Patient: Jasmine Gardner  Procedure(s) Performed: COLONOSCOPY WITH PROPOFOL  Patient location during evaluation: PACU Anesthesia Type: General Level of consciousness: awake and alert, oriented and patient cooperative Pain management: pain level controlled Vital Signs Assessment: post-procedure vital signs reviewed and stable Respiratory status: spontaneous breathing, nonlabored ventilation and respiratory function stable Cardiovascular status: blood pressure returned to baseline and stable Postop Assessment: adequate PO intake Anesthetic complications: no   No notable events documented.   Last Vitals:  Vitals:   01/06/21 1213 01/06/21 1223  BP: (!) 171/88 (!) 161/85  Pulse: 78 75  Resp: 14 15  Temp:    SpO2: 96% 96%    Last Pain:  Vitals:   01/06/21 1223  TempSrc:   PainSc: 0-No pain                 Darrin Nipper

## 2021-01-06 NOTE — OR Nursing (Signed)
[place] used during admission.

## 2021-01-06 NOTE — H&P (Signed)
Jasmine Antigua, MD [address], [place], [place], [place], [address] [address], [place], [place], [place], [address] Phone: [phone]  Fax: [phone]  Primary Care Physician:  Freddy Finner, NP   Pre-Procedure History & Physical: HPI:  Jasmine Gardner is a 70 y.o. female is here for a colonoscopy.   Past Medical History:  Diagnosis Date   Arthritis    Cancer ([place])    breast   Depression    GERD (gastroesophageal reflux disease)    Headache    Hypertension    Personal history of radiation therapy    Post-menopausal    UTI (urinary tract infection)     Past Surgical History:  Procedure Laterality Date   BREAST BIOPSY Left 11/08/2016   bx +   BREAST EXCISIONAL BIOPSY Left 11/24/2016   lumpectomy with rad   BREAST LUMPECTOMY Left 11/24/2016   COLONOSCOPY  2007   PARTIAL MASTECTOMY WITH NEEDLE LOCALIZATION Left 11/25/2015   Procedure: PARTIAL MASTECTOMY WITH NEEDLE LOCALIZATION;  Surgeon: Leonie Green, MD;  Location: [facility] ORS;  Service: General;  Laterality: Left;   SENTINEL NODE BIOPSY Left 11/25/2015   Procedure: SENTINEL NODE BIOPSY;  Surgeon: Leonie Green, MD;  Location: [facility] ORS;  Service: General;  Laterality: Left;   TUBAL LIGATION      Prior to Admission medications   Medication Sig Start Date End Date Taking? Authorizing Provider  alendronate (FOSAMAX) 70 MG tablet Take 1 tablet (70 mg total) by mouth once a week. Take with a full glass of water on an empty stomach. 09/09/17  Yes Lloyd Huger, MD  amLODipine (NORVASC) 5 MG tablet Take 5 mg by mouth daily.   Yes [provider]  bisoprolol-hydrochlorothiazide (ZIAC) 10-6.25 MG tablet Take 1 tablet by mouth 2 (two) times daily.   Yes [provider]  lisinopril (PRINIVIL,ZESTRIL) 40 MG tablet Take 40 mg by mouth at bedtime.   Yes [provider]  losartan (COZAAR) 100 MG tablet Take 100 mg by mouth daily.   Yes [provider]  methocarbamol  (ROBAXIN) 500 MG tablet Take 1 tablet (500 mg total) by mouth in the morning and at bedtime. 11/19/19  Yes Jacquelin Hawking, NP  omeprazole (PRILOSEC) 20 MG capsule Take 20 mg by mouth daily.    Yes [provider]  pravastatin (PRAVACHOL) 20 MG tablet Take 20 mg by mouth at bedtime.   Yes [provider]  sertraline (ZOLOFT) 25 MG tablet Take 25 mg by mouth daily.   Yes [provider]  tamoxifen (NOLVADEX) 20 MG tablet TAKE 1 TABLET BY MOUTH DAILY. 09/30/20  Yes Verlon Au, NP  Calcium Carb-Cholecalciferol 500-400 MG-UNIT TABS Take by mouth. Patient not taking: Reported on 11/19/2019    [provider]  estradiol (ESTRACE VAGINAL) 0.1 MG/GM vaginal cream Place vaginally. Patient not taking: Reported on 11/19/2019 07/25/17   [provider]  hydrOXYzine (VISTARIL) 25 MG capsule Take 25 mg by mouth as needed. Patient not taking: Reported on 11/19/2019    [provider]  ibuprofen (ADVIL) 800 MG tablet Take 1 tablet (800 mg total) by mouth every 8 (eight) hours as needed. 07/29/19   Kris Hartmann, NP    Allergies as of 12/20/2020   (No Known Allergies)    Family History  Problem Relation Age of Onset   Bladder Cancer Neg Hx    Kidney cancer Neg Hx     Social History   Socioeconomic History   Marital status:  Single    Spouse name: Not on file   Number of children: Not on file   Years of education: Not on file   Highest education level: Not on file  Occupational History   Not on file  Tobacco Use   Smoking status: Never   Smokeless tobacco: Never  Vaping Use   Vaping Use: Never used  Substance and Sexual Activity   Alcohol use: No   Drug use: No   Sexual activity: Not on file  Other Topics Concern   Not on file  Social History Narrative   Not on file   Social Determinants of Health   Financial Resource Strain: Not on file  Food Insecurity: Not on file  Transportation Needs: Not on file  Physical Activity: Not on  file  Stress: Not on file  Social Connections: Not on file  Intimate Partner Violence: Not on file    Review of Systems: See HPI, otherwise negative ROS  Physical Exam: Constitutional: General:   Alert,  Well-developed, well-nourished, pleasant and cooperative in NAD BP (!) 142/98   Pulse 91   Temp (!) 96.2 F (35.7 C)   Resp 20   Ht 4\' 11"  (1.499 m)   Wt 75.8 kg   SpO2 97%   BMI 33.73 kg/m   Head: Normocephalic, atraumatic.   Eyes:  Sclera clear, no icterus.   Conjunctiva pink.   Mouth:  No deformity or lesions, oropharynx pink & moist.  Neck:  Supple, trachea midline  Respiratory: Normal respiratory effort  Gastrointestinal:  Soft, non-tender and non-distended without masses, hepatosplenomegaly or hernias noted.  No guarding or rebound tenderness.     Cardiac: No clubbing or edema.  No cyanosis. Normal posterior tibial pedal pulses noted.  Lymphatic:  No significant cervical adenopathy.  Psych:  Alert and cooperative. Normal mood and affect.  Musculoskeletal:   Symmetrical without gross deformities. 5/5 Lower extremity strength bilaterally.  Skin: Warm. Intact without significant lesions or rashes. No jaundice.  Neurologic:  Face symmetrical, tongue midline, Normal sensation to touch;  grossly normal neurologically.  Psych:  Alert and oriented x3, Alert and cooperative. Normal mood and affect.  Impression/Plan: Jasmine Gardner is here for a colonoscopy to be performed for average risk screening.  Risks, benefits, limitations, and alternatives regarding  colonoscopy have been reviewed with the patient.  Questions have been answered.  All parties agreeable.   Virgel Manifold, MD  01/06/2021, 11:38 AM

## 2021-01-06 NOTE — Transfer of Care (Signed)
Immediate Anesthesia Transfer of Care Note  Patient: Jasmine Gardner  Procedure(s) Performed: COLONOSCOPY WITH PROPOFOL  Patient Location: PACU  Anesthesia Type:General  Level of Consciousness: drowsy and patient cooperative  Airway & Oxygen Therapy: Patient Spontanous Breathing and Patient connected to face mask oxygen  Post-op Assessment: Report given to RN and Post -op Vital signs reviewed and stable  Post vital signs: Reviewed and stable  Last Vitals:  Vitals Value Taken Time  BP 122/62 01/06/21 1153  Temp 36.1 C 01/06/21 1153  Pulse 72 01/06/21 1155  Resp 18 01/06/21 1155  SpO2 99 % 01/06/21 1155  Vitals shown include unvalidated device data.  Last Pain:  Vitals:   01/06/21 1153  TempSrc: Temporal  PainSc: Asleep         Complications: No notable events documented.

## 2021-01-06 NOTE — Anesthesia Preprocedure Evaluation (Signed)
Anesthesia Evaluation  Patient identified by MRN, date of birth, ID band Patient awake    Reviewed: Allergy & Precautions, NPO status , Patient's Chart, lab work & pertinent test results  History of Anesthesia Complications Negative for: history of anesthetic complications  Airway Mallampati: I   Neck ROM: Full    Dental  (+) Upper Dentures Missing lower left teeth x3:   Pulmonary neg pulmonary ROS,    Pulmonary exam normal breath sounds clear to auscultation       Cardiovascular hypertension, Normal cardiovascular exam Rhythm:Regular Rate:Normal     Neuro/Psych  Headaches, PSYCHIATRIC DISORDERS Depression    GI/Hepatic GERD  ,  Endo/Other  Obesity   Renal/GU negative Renal ROS     Musculoskeletal  (+) Arthritis ,   Abdominal   Peds  Hematology Breast CA   Anesthesia Other Findings   Reproductive/Obstetrics                             Anesthesia Physical Anesthesia Plan  ASA: 2  Anesthesia Plan: General   Post-op Pain Management:    Induction: Intravenous  PONV Risk Score and Plan: 3 and Propofol infusion, TIVA and Treatment may vary due to age or medical condition  Airway Management Planned: Natural Airway  Additional Equipment:   Intra-op Plan:   Post-operative Plan:   Informed Consent: I have reviewed the patients History and Physical, chart, labs and discussed the procedure including the risks, benefits and alternatives for the proposed anesthesia with the patient or authorized representative who has indicated his/her understanding and acceptance.       Plan Discussed with: CRNA  Anesthesia Plan Comments:         Anesthesia Quick Evaluation

## 2021-01-06 NOTE — Anesthesia Procedure Notes (Signed)
Procedure Name: General with mask airway Date/Time: 01/06/2021 11:44 AM Performed by: Kelton Pillar, CRNA Pre-anesthesia Checklist: Patient identified, Emergency Drugs available, Suction available and Patient being monitored Patient Re-evaluated:Patient Re-evaluated prior to induction Oxygen Delivery Method: Simple face mask Induction Type: IV induction Placement Confirmation: positive ETCO2 and CO2 detector Dental Injury: Teeth and Oropharynx as per pre-operative assessment

## 2021-01-09 ENCOUNTER — Encounter: Payer: Self-pay | Admitting: Gastroenterology

## 2021-01-09 LAB — SURGICAL PATHOLOGY

## 2021-01-10 ENCOUNTER — Encounter: Payer: Self-pay | Admitting: Gastroenterology

## 2021-01-16 ENCOUNTER — Encounter: Payer: Self-pay | Admitting: Primary Care

## 2021-01-25 ENCOUNTER — Telehealth: Payer: Self-pay | Admitting: Oncology

## 2021-01-25 NOTE — Telephone Encounter (Signed)
Interpreter spoke with the patients son and he informed her that we need to contact the mothers phone number, Herb Grays did attempt to reach the patient at the new number given with no answer. She left VM for patient to contact so we could reschedule her missed 6 mnth appt per the request of Princella Ion.

## 2021-04-19 ENCOUNTER — Telehealth: Payer: Self-pay | Admitting: Oncology

## 2021-04-19 NOTE — Telephone Encounter (Signed)
[facility] called to get a follow up appt for pt. Pt can't read or write so if you need assisstance you can call Janett Billow at [phone]

## 2021-05-08 NOTE — Progress Notes (Signed)
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 Low risk Oncotype Dx of 15.  INTERVAL HISTORY: Patient returns to clinic today for routine 27-monthevaluation.  She continues to have joint pain, but states this is improved.  She completed her most recent prescription of tamoxifen approximately 2 weeks ago and did not get it refilled.  She also reports she is not taking Fosamax as prescribed.  She has no neurologic complaints. She denies any recent fevers or illnesses. She has a good appetite and denies weight loss.  She denies any chest pain, shortness of breath, cough, or hemoptysis.  She denies any nausea, vomiting, constipation, or diarrhea. She has no urinary complaints.  Patient offers no further specific complaints today.  REVIEW OF SYSTEMS:   Review of Systems  Constitutional: Negative.  Negative for fever, malaise/fatigue and weight loss.  Respiratory: Negative.  Negative for cough and shortness of breath.   Cardiovascular: Negative.  Negative for chest pain and leg swelling.  Gastrointestinal: Negative.  Negative for abdominal pain.  Genitourinary: Negative.  Negative for dysuria.  Musculoskeletal:  Positive for joint pain. Negative for myalgias and neck pain.  Skin: Negative.  Negative for rash.  Neurological: Negative.  Negative for sensory change, focal weakness, weakness and headaches.  Psychiatric/Behavioral: Negative.  Negative for depression. The patient is not nervous/anxious.    As per HPI. Otherwise, a complete review of systems is negative.  PAST MEDICAL HISTORY: Past Medical History:  Diagnosis Date   Arthritis    Cancer [place]    breast   Depression    GERD  (gastroesophageal reflux disease)    Headache    Hypertension    Personal history of radiation therapy    Post-menopausal    UTI (urinary tract infection)     PAST SURGICAL HISTORY: Past Surgical History:  Procedure Laterality Date   BREAST BIOPSY Left 11/08/2016   bx +   BREAST EXCISIONAL BIOPSY Left 11/24/2016   lumpectomy with rad   BREAST LUMPECTOMY Left 11/24/2016   COLONOSCOPY  2007   COLONOSCOPY WITH PROPOFOL N/A 01/06/2021   Procedure: COLONOSCOPY WITH PROPOFOL;  Surgeon: TVirgel Manifold MD;  Location: [facility] ENDOSCOPY;  Service: Endoscopy;  Laterality: N/A;   PARTIAL MASTECTOMY WITH NEEDLE LOCALIZATION Left 11/25/2015   Procedure: PARTIAL MASTECTOMY WITH NEEDLE LOCALIZATION;  Surgeon: JLeonie Green MD;  Location: [facility] ORS;  Service: General;  Laterality: Left;   SENTINEL NODE BIOPSY Left 11/25/2015   Procedure: SENTINEL NODE BIOPSY;  Surgeon: JLeonie Green MD;  Location: [facility] ORS;  Service: General;  Laterality: Left;   TUBAL LIGATION      FAMILY HISTORY: Reviewed and unchanged. No reported history of malignancy or chronic disease.     ADVANCED DIRECTIVES (Y/N):  N   HEALTH MAINTENANCE: Social History   Tobacco Use   Smoking status: Never   Smokeless tobacco: Never  Vaping Use   Vaping Use: Never used  Substance Use Topics   Alcohol use: No   Drug use: No     Colonoscopy:  PAP:  Bone density:  Lipid panel:  No Known Allergies  Current Outpatient Medications  Medication Sig Dispense Refill   pravastatin (PRAVACHOL) 20 MG tablet Take 20 mg by mouth at bedtime.  tamoxifen (NOLVADEX) 20 MG tablet TAKE 1 TABLET BY MOUTH DAILY. 30 tablet 0   alendronate (FOSAMAX) 70 MG tablet Take 1 tablet (70 mg total) by mouth once a week. Take with a full glass of water on an empty stomach. 12 tablet 3   amLODipine (NORVASC) 5 MG tablet Take 5 mg by mouth daily.     bisoprolol-hydrochlorothiazide (ZIAC) 10-6.25 MG tablet Take 1 tablet by mouth 2 (two)  times daily.     Calcium Carb-Cholecalciferol 500-400 MG-UNIT TABS Take by mouth. (Patient not taking: Reported on 11/19/2019)     estradiol (ESTRACE VAGINAL) 0.1 MG/GM vaginal cream Place vaginally. (Patient not taking: Reported on 11/19/2019)     hydrOXYzine (VISTARIL) 25 MG capsule Take 25 mg by mouth as needed. (Patient not taking: Reported on 11/19/2019)     ibuprofen (ADVIL) 800 MG tablet Take 1 tablet (800 mg total) by mouth every 8 (eight) hours as needed. 30 tablet 3   lisinopril (PRINIVIL,ZESTRIL) 40 MG tablet Take 40 mg by mouth at bedtime.     losartan (COZAAR) 100 MG tablet Take 100 mg by mouth daily.     methocarbamol (ROBAXIN) 500 MG tablet Take 1 tablet (500 mg total) by mouth in the morning and at bedtime. 90 tablet 0   omeprazole (PRILOSEC) 20 MG capsule Take 20 mg by mouth daily.      sertraline (ZOLOFT) 25 MG tablet Take 25 mg by mouth daily. (Patient not taking: Reported on 05/11/2021)     No current facility-administered medications for this visit.    OBJECTIVE: Vitals:   05/11/21 0935  BP: 133/79  Pulse: (!) 106  Resp: 20  Temp: 98.7 F (37.1 C)  SpO2: 100%     Body mass index is 33.37 kg/m.    ECOG FS:0 - Asymptomatic  General: Well-developed, well-nourished, no acute distress. Eyes: Pink conjunctiva, anicteric sclera. HEENT: Normocephalic, moist mucous membranes. Breast: Exam deferred today. Lungs: No audible wheezing or coughing. Heart: Regular rate and rhythm. Abdomen: Soft, nontender, no obvious distention. Musculoskeletal: No edema, cyanosis, or clubbing. Neuro: Alert, answering all questions appropriately. Cranial nerves grossly intact. Skin: No rashes or petechiae noted. Psych: Normal affect.   LAB RESULTS:  Lab Results  Component Value Date   NA 134 (L) 06/24/2016   K 4.5 06/24/2016   CL 100 (L) 06/24/2016   CO2 27 06/24/2016   GLUCOSE 103 (H) 06/24/2016   BUN <5 (L) 06/24/2016   CREATININE 0.52 06/24/2016   CALCIUM 9.6 06/24/2016    GFRNONAA >60 06/24/2016   GFRAA >60 06/24/2016    Lab Results  Component Value Date   WBC 5.5 02/14/2016   HGB 12.6 02/14/2016   HCT 37.5 02/14/2016   MCV 83.1 02/14/2016   PLT 198 02/14/2016     STUDIES: No results found.  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 Low risk Oncotype DX and 15.  PLAN:    1. 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  She also completed adjuvant XRT.  Given her low risk Oncotype, she did not require adjuvant chemotherapy. Patient could not tolerate letrozole, therefore was switched to anastrozole.  Because of her osteoporosis, anastrozole was discontinued and she was given a prescription for tamoxifen.  Although recommendation was to complete 5 years of treatment in June 2023, she discontinued tamoxifen several weeks ago.  Her most recent mammogram on December 15, 2020 was reported as BI-RADS 1.  Repeat in September 2023.  Follow-up several days after her mammogram for routine evaluation at which point patient can be transitioned to yearly visits.  2. Osteoporosis: Patient's most recent bone mineral density on December 15, 2020 revealed a T score of -3.4, which is worse than 1 year prior where is -3.1.  Patient admits to not taking her Fosamax as prescribed.  She does not take calcium or vitamin D supplements secondary to GI upset.  Patient was given a prescription for Fosamax again today and given dietary recommendations for calcium supplementation.  Repeat in September 2023.   3. Depression: Continue Zoloft as prescribed. 4.  Joint pain: Improved.  The entire visit was done in the presence of an interpreter.  Patient expressed understanding and was in agreement with this plan. She also understands that She can call clinic at any time with any  questions, concerns, or complaints.    Cancer Staging  Primary cancer of upper outer quadrant of left female breast [facility]) Staging form: Breast, AJCC 7th Edition - Clinical stage from 11/16/2015: Stage IA (T1b, N0, M0) - Signed by Lloyd Huger, MD on 11/17/2015 Laterality: Left Estrogen receptor status: Positive Progesterone receptor status: Positive HER2 status: Negative   Faythe Casa, NP 05/12/2021 9:58 AM

## 2021-05-11 ENCOUNTER — Encounter: Payer: Self-pay | Admitting: Oncology

## 2021-05-11 ENCOUNTER — Other Ambulatory Visit: Payer: Self-pay

## 2021-05-11 ENCOUNTER — Inpatient Hospital Stay: Payer: Medicare Other | Attending: Oncology | Admitting: Oncology

## 2021-05-11 ENCOUNTER — Encounter (INDEPENDENT_AMBULATORY_CARE_PROVIDER_SITE_OTHER): Payer: Self-pay

## 2021-05-11 VITALS — BP 133/79 | HR 106 | Temp 98.7°F | Resp 20 | Wt 165.2 lb

## 2021-05-11 DIAGNOSIS — I10 Essential (primary) hypertension: Secondary | ICD-10-CM | POA: Diagnosis not present

## 2021-05-11 DIAGNOSIS — M810 Age-related osteoporosis without current pathological fracture: Secondary | ICD-10-CM | POA: Diagnosis not present

## 2021-05-11 DIAGNOSIS — M818 Other osteoporosis without current pathological fracture: Secondary | ICD-10-CM

## 2021-05-11 DIAGNOSIS — M2550 Pain in unspecified joint: Secondary | ICD-10-CM | POA: Insufficient documentation

## 2021-05-11 DIAGNOSIS — C50412 Malignant neoplasm of upper-outer quadrant of left female breast: Secondary | ICD-10-CM | POA: Insufficient documentation

## 2021-05-11 DIAGNOSIS — Z170 Estrogen receptor positive status [ER+]: Secondary | ICD-10-CM | POA: Insufficient documentation

## 2021-05-11 DIAGNOSIS — Z1231 Encounter for screening mammogram for malignant neoplasm of breast: Secondary | ICD-10-CM | POA: Diagnosis not present

## 2021-05-11 DIAGNOSIS — F32A Depression, unspecified: Secondary | ICD-10-CM | POA: Diagnosis not present

## 2021-05-11 MED ORDER — ALENDRONATE SODIUM 70 MG PO TABS: 70.0000 mg | ORAL_TABLET | ORAL | 3 refills | Status: DC

## 2021-05-11 NOTE — Progress Notes (Signed)
Patient states she has been taking tamoxifen for more than 5 years.   Will call patient to go over her medication list because patient was unsure on what she was taking.

## 2021-10-18 ENCOUNTER — Other Ambulatory Visit: Payer: Self-pay | Admitting: Family Medicine

## 2021-10-18 DIAGNOSIS — R1909 Other intra-abdominal and pelvic swelling, mass and lump: Secondary | ICD-10-CM

## 2021-10-24 ENCOUNTER — Encounter: Payer: Self-pay | Admitting: Primary Care

## 2021-11-08 ENCOUNTER — Ambulatory Visit
Admission: RE | Admit: 2021-11-08 | Discharge: 2021-11-08 | Disposition: A | Discharge status: Home / Self Care | Payer: Medicare Other | Source: Ambulatory Visit | Attending: Family Medicine | Admitting: Family Medicine

## 2021-11-08 DIAGNOSIS — R1909 Other intra-abdominal and pelvic swelling, mass and lump: Secondary | ICD-10-CM | POA: Diagnosis present

## 2021-11-08 LAB — POCT I-STAT CREATININE: Creatinine, Ser: 0.6 mg/dL (ref 0.44–1.00)

## 2021-11-08 MED ORDER — IOHEXOL 300 MG/ML  SOLN
100.0000 mL | Freq: Once | INTRAMUSCULAR | Status: AC | PRN
  Administered 2021-11-08: 100 mL via INTRAVENOUS

## 2021-12-18 ENCOUNTER — Ambulatory Visit
Admission: RE | Admit: 2021-12-18 | Discharge: 2021-12-18 | Disposition: A | Discharge status: Home / Self Care | Payer: Medicare Other | Source: Ambulatory Visit | Attending: Oncology | Admitting: Oncology

## 2021-12-18 DIAGNOSIS — C50412 Malignant neoplasm of upper-outer quadrant of left female breast: Secondary | ICD-10-CM | POA: Diagnosis not present

## 2021-12-18 DIAGNOSIS — M810 Age-related osteoporosis without current pathological fracture: Secondary | ICD-10-CM | POA: Diagnosis present

## 2021-12-18 DIAGNOSIS — Z1231 Encounter for screening mammogram for malignant neoplasm of breast: Secondary | ICD-10-CM | POA: Diagnosis present

## 2021-12-22 ENCOUNTER — Encounter: Payer: Self-pay | Admitting: Nurse Practitioner

## 2021-12-22 ENCOUNTER — Inpatient Hospital Stay: Payer: Medicare Other | Attending: Nurse Practitioner | Admitting: Nurse Practitioner

## 2021-12-22 VITALS — BP 143/81 | HR 106 | Temp 99.2°F | Resp 16 | Ht 59.0 in | Wt 176.0 lb

## 2021-12-22 DIAGNOSIS — Z853 Personal history of malignant neoplasm of breast: Secondary | ICD-10-CM | POA: Insufficient documentation

## 2021-12-22 DIAGNOSIS — M2550 Pain in unspecified joint: Secondary | ICD-10-CM | POA: Insufficient documentation

## 2021-12-22 DIAGNOSIS — F32A Depression, unspecified: Secondary | ICD-10-CM | POA: Insufficient documentation

## 2021-12-22 DIAGNOSIS — I10 Essential (primary) hypertension: Secondary | ICD-10-CM | POA: Diagnosis not present

## 2021-12-22 DIAGNOSIS — Z1231 Encounter for screening mammogram for malignant neoplasm of breast: Secondary | ICD-10-CM | POA: Diagnosis not present

## 2021-12-22 DIAGNOSIS — Z08 Encounter for follow-up examination after completed treatment for malignant neoplasm: Secondary | ICD-10-CM | POA: Diagnosis not present

## 2021-12-22 DIAGNOSIS — Z79899 Other long term (current) drug therapy: Secondary | ICD-10-CM | POA: Diagnosis not present

## 2021-12-22 DIAGNOSIS — M810 Age-related osteoporosis without current pathological fracture: Secondary | ICD-10-CM | POA: Insufficient documentation

## 2021-12-22 DIAGNOSIS — K5900 Constipation, unspecified: Secondary | ICD-10-CM | POA: Insufficient documentation

## 2021-12-22 NOTE — Progress Notes (Signed)
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 Low risk Oncotype Dx of 15.  INTERVAL HISTORY: Patient returns to clinic today for continued surveillance of breast cancer. She is not taking AI or tamoxifen d/t intolerance. Took fosamax for 3 months and discontinued d/t aches and pain. Now resolved and she feels well. Performs self breast exams regularly and denies pain or lumps. She has no neurologic complaints. She denies any recent fevers or illnesses. She has a good appetite and denies weight loss.  She denies any chest pain, shortness of breath, cough, or hemoptysis.  She denies any nausea, vomiting, constipation, or diarrhea. She has no urinary complaints.  Patient offers no further specific complaints today.  REVIEW OF SYSTEMS:   Review of Systems  Constitutional:  Negative for chills, fever, malaise/fatigue and weight loss.  HENT:  Negative for hearing loss, nosebleeds, sore throat and tinnitus.   Eyes:  Negative for blurred vision and double vision.  Respiratory:  Negative for cough, hemoptysis, shortness of breath and wheezing.   Cardiovascular:  Negative for chest pain, palpitations and leg swelling.  Gastrointestinal:  Negative for abdominal pain, blood in stool, constipation, diarrhea, melena, nausea and vomiting.  Genitourinary:  Negative for dysuria and urgency.  Musculoskeletal:  Negative for back pain, falls, joint pain and myalgias.  Skin:  Negative for itching and rash.  Neurological:  Negative for dizziness, tingling, sensory change, loss of consciousness, weakness and headaches.  Endo/Heme/Allergies:  Negative for environmental allergies. Does not bruise/bleed easily.   Psychiatric/Behavioral:  Negative for depression. The patient is not nervous/anxious and does not have insomnia.   As per HPI. Otherwise, a complete review of systems is negative.  PAST MEDICAL HISTORY: Past Medical History:  Diagnosis Date   Arthritis    Cancer ([place])    breast   Depression    GERD (gastroesophageal reflux disease)    Headache    Hypertension    Personal history of radiation therapy    Post-menopausal    UTI (urinary tract infection)     PAST SURGICAL HISTORY: Past Surgical History:  Procedure Laterality Date   BREAST BIOPSY Left 11/08/2016   bx +   BREAST EXCISIONAL BIOPSY Left 11/24/2016   lumpectomy with rad   BREAST LUMPECTOMY Left 11/24/2016   COLONOSCOPY  2007   COLONOSCOPY WITH PROPOFOL N/A 01/06/2021   Procedure: COLONOSCOPY WITH PROPOFOL;  Surgeon: Virgel Manifold, MD;  Location: [facility];  Service: Endoscopy;  Laterality: N/A;   PARTIAL MASTECTOMY WITH NEEDLE LOCALIZATION Left 11/25/2015   Procedure: PARTIAL MASTECTOMY WITH NEEDLE LOCALIZATION;  Surgeon: Leonie Green, MD;  Location: [facility] ORS;  Service: General;  Laterality: Left;   SENTINEL NODE BIOPSY Left 11/25/2015   Procedure: SENTINEL NODE BIOPSY;  Surgeon: Leonie Green, MD;  Location: [facility] ORS;  Service: General;  Laterality: Left;   TUBAL LIGATION      FAMILY HISTORY: Reviewed and unchanged. No reported history of malignancy or chronic disease.     ADVANCED DIRECTIVES (Y/N):  N   HEALTH MAINTENANCE: Social History   Tobacco Use   Smoking status: Never   Smokeless tobacco: Never  Vaping Use   Vaping Use: Never used  Substance Use Topics   Alcohol use: No   Drug  use: No     Colonoscopy:  PAP:  Bone density:  Lipid panel:  No Known Allergies  Current Outpatient Medications  Medication Sig Dispense Refill   alendronate (FOSAMAX) 70 MG tablet Take 1 tablet (70 mg total) by mouth once a week. Take with a full glass of water on an empty stomach. 12  tablet 3   amLODipine (NORVASC) 5 MG tablet Take 5 mg by mouth daily.     bisoprolol-hydrochlorothiazide (ZIAC) 10-6.25 MG tablet Take 1 tablet by mouth 2 (two) times daily.     lisinopril (PRINIVIL,ZESTRIL) 40 MG tablet Take 40 mg by mouth at bedtime.     losartan (COZAAR) 100 MG tablet Take 100 mg by mouth daily.     omeprazole (PRILOSEC) 20 MG capsule Take 20 mg by mouth daily.      pravastatin (PRAVACHOL) 20 MG tablet Take 20 mg by mouth at bedtime.     Calcium Carb-Cholecalciferol 500-400 MG-UNIT TABS Take by mouth. (Patient not taking: Reported on 11/19/2019)     estradiol (ESTRACE VAGINAL) 0.1 MG/GM vaginal cream Place vaginally. (Patient not taking: Reported on 11/19/2019)     hydrOXYzine (VISTARIL) 25 MG capsule Take 25 mg by mouth as needed. (Patient not taking: Reported on 11/19/2019)     ibuprofen (ADVIL) 800 MG tablet Take 1 tablet (800 mg total) by mouth every 8 (eight) hours as needed. (Patient not taking: Reported on 12/22/2021) 30 tablet 3   methocarbamol (ROBAXIN) 500 MG tablet Take 1 tablet (500 mg total) by mouth in the morning and at bedtime. (Patient not taking: Reported on 12/22/2021) 90 tablet 0   sertraline (ZOLOFT) 25 MG tablet Take 25 mg by mouth daily. (Patient not taking: Reported on 05/11/2021)     tamoxifen (NOLVADEX) 20 MG tablet TAKE 1 TABLET BY MOUTH DAILY. (Patient not taking: Reported on 12/22/2021) 30 tablet 0   No current facility-administered medications for this visit.    OBJECTIVE: Vitals:   12/22/21 1017  BP: (!) 143/81  Pulse: (!) 106  Resp: 16  Temp: 99.2 F (37.3 C)  SpO2: 97%     Body mass index is 35.55 kg/m.    ECOG FS:0 - Asymptomatic  General: Well-developed, well-nourished, no acute distress. Eyes: Pink conjunctiva, anicteric sclera. Lungs: Clear to auscultation bilaterally.  No audible wheezing or coughing Heart: Regular rate and rhythm.  Abdomen: Soft, nontender, nondistended.  Musculoskeletal: No edema, cyanosis, or clubbing. Neuro:  Alert, answering all questions appropriately. Cranial nerves grossly intact. Skin: No rashes or petechiae noted. Psych: Normal affect. Breast: declined.   LAB RESULTS: No labs on site today.  STUDIES: MM 3D SCREEN BREAST BILATERAL  Result Date: 12/18/2021 CLINICAL DATA:  Screening. EXAM: DIGITAL SCREENING BILATERAL MAMMOGRAM WITH TOMOSYNTHESIS AND CAD TECHNIQUE: Bilateral screening digital craniocaudal and mediolateral oblique mammograms were obtained. Bilateral screening digital breast tomosynthesis was performed. The images were evaluated with computer-aided detection. COMPARISON:  Previous exam(s). ACR Breast Density Category b: There are scattered areas of fibroglandular density. FINDINGS: There are no findings suspicious for malignancy. IMPRESSION: No mammographic evidence of malignancy. A result letter of this screening mammogram will be mailed directly to the patient. RECOMMENDATION: Screening mammogram in one year. (Code:SM-B-01Y) BI-RADS CATEGORY  1: Negative. Electronically Signed   By: Lillia Mountain M.D.   On: 12/18/2021 16:19   DG Bone Density  Result Date: 12/18/2021 EXAM: DUAL X-RAY ABSORPTIOMETRY (DXA) FOR BONE MINERAL DENSITY IMPRESSION: Your patient Trysten Berti completed a BMD test on 12/18/2021 using the [employer] (software  version: 14.10) manufactured by [employer]. The following summarizes the results of our evaluation. Technologist: [facility] PATIENT BIOGRAPHICAL: Name: Sherma, Vanmetre Patient ID: [id number] Birth Date: 01/13/1951 Height: 59.0 in. Gender: Female Exam Date: 12/18/2021 Weight: 176.1 lbs. Indications: Advanced Age, Postmenopausal, History of Breast Cancer, History of Radiation, Height Loss, History of Fracture (Adult) Fractures: Treatments: Calcium DENSITOMETRY RESULTS: Site         Region     Measured Date Measured Age WHO Classification Young Adult T-score BMD         %Change vs. Previous Significant Change (*) DualFemur Neck Right  12/18/2021 72.4 Osteopenia -1.5 0.834 g/cm2 0.1% - DualFemur Neck Right 12/15/2020 71.4 Osteopenia -1.5 0.833 g/cm2 -2.0% - DualFemur Neck Right 04/23/2019 69.8 Osteopenia -1.4 0.850 g/cm2 1.4% - DualFemur Neck Right 04/21/2018 68.8 Osteopenia -1.4 0.838 g/cm2 -2.3% - DualFemur Neck Right 04/17/2017 67.8 Osteopenia -1.3 0.858 g/cm2 2.3% - DualFemur Neck Right 10/25/2015 66.3 Osteopenia -1.4 0.839 g/cm2 - - DualFemur Total Mean 12/18/2021 72.4 Normal -0.4 0.951 g/cm2 1.8% - DualFemur Total Mean 12/15/2020 71.4 Normal -0.6 0.934 g/cm2 -0.2% - DualFemur Total Mean 04/23/2019 69.8 Normal -0.6 0.936 g/cm2 5.4% Yes DualFemur Total Mean 04/21/2018 68.8 Normal -1.0 0.888 g/cm2 -2.8% Yes DualFemur Total Mean 04/17/2017 67.8 Normal -0.7 0.914 g/cm2 -1.1% - DualFemur Total Mean 10/25/2015 66.3 Normal -0.7 0.924 g/cm2 - - Left Forearm Radius 33% 12/18/2021 72.4 Osteoporosis -3.2 0.599 g/cm2 3.1% - Left Forearm Radius 33% 12/15/2020 71.4 Osteoporosis -3.4 0.581 g/cm2 -3.8% - Left Forearm Radius 33% 04/23/2019 69.8 Osteoporosis -3.1 0.604 g/cm2 -0.8% - Left Forearm Radius 33% 04/21/2018 68.8 Osteoporosis -3.0 0.609 g/cm2 3.6% - Left Forearm Radius 33% 04/17/2017 67.8 Osteoporosis -3.3 0.588 g/cm2 - - ASSESSMENT: The BMD measured at Forearm Radius 33% is 0.599 g/cm2 with a T-score of -3.2. This patient is considered osteoporotic according to [facility]) criteria. The scan quality is good. Lumbar spine was not utilized due to advanced degenerative changes. Compared with prior study, there has been no significant change in the total hip. World Pharmacologist [facility]) criteria for post-menopausal, Caucasian Women: Normal:                   T-score at or above -1 SD Osteopenia/low bone mass: T-score between -1 and -2.5 SD Osteoporosis:             T-score at or below -2.5 SD RECOMMENDATIONS: 1. All patients should optimize calcium and vitamin D intake. 2. Consider FDA-approved medical therapies in postmenopausal  women and men aged 68 years and older, based on the following: a. A hip or vertebral(clinical or morphometric) fracture b. T-score < -2.5 at the femoral neck or spine after appropriate evaluation to exclude secondary causes c. Low bone mass (T-score between -1.0 and -2.5 at the femoral neck or spine) and a 10-year probability of a hip fracture > 3% or a 10-year probability of a major osteoporosis-related fracture > 20% based on the [place]-adapted WHO algorithm 3. Clinician judgment and/or patient preferences may indicate treatment for people with 10-year fracture probabilities above or below these levels FOLLOW-UP: People with diagnosed cases of osteoporosis or at high risk for fracture should have regular bone mineral density tests. For patients eligible for Medicare, routine testing is allowed once every 2 years. The testing frequency can be increased to one year for patients who have rapidly progressing disease, those who are receiving or discontinuing medical therapy to restore bone mass, or have additional risk factors. I have reviewed  this report, and agree with the above findings. Mark A. Thornton Papas, M.D. [facility] Electronically Signed   By: Lavonia Dana M.D.   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 Low risk Oncotype DX and 15.  PLAN:    1. 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  She also completed adjuvant XRT.  Given her low risk Oncotype, she did not require adjuvant chemotherapy. Patient could not tolerate letrozole, therefore was switched to anastrozole.  Because of her osteoporosis, anastrozole was discontinued and she was given a prescription for tamoxifen.  Although recommendation was to complete 5 years of treatment in June 2023, she discontinued  tamoxifen several weeks ago.  Her most recent mammogram on December 18, 2021 was reported as BI-RADS 1. Clinically, asymptomatic of recurrence. Repeat in September 2024.  Follow-up several days after her mammogram for annual visit.    2. Osteoporosis: Patient's most recent bone mineral density on December 15, 2020 revealed a T score of -3.4, which is worse than 1 year prior where is -3.1.  Most recent BMD on 12/18/21 was T score -3.2. Essentially stable to slightly improved. No longer taking fosamax d/t arthralgias. She prefers to increase intake of calcium and vitamin d rich foods. Repeat in September 2024.   3. Depression: Previously on zoloft. Declines to take.   4.  Joint pain: Improved.  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   6. Constipation- chronic. Follow up with PCP.   The entire visit was done in the presence of an interpreter.  Patient expressed understanding and was in agreement with this plan. She also understands that She can call clinic at any time with any questions, concerns, or complaints.    Cancer Staging  Primary cancer of upper outer quadrant of left female breast [facility]) Staging form: Breast, AJCC 7th Edition - Clinical stage from 11/16/2015: Stage IA (T1b, N0, M0) - Signed by Lloyd Huger, MD on 11/17/2015 Laterality: Left Estrogen receptor status: Positive Progesterone receptor status: Positive HER2 status: Negative   Beckey Rutter, DNP, AGNP-C [place] at [facility] [phone] (clinic) 12/22/2021

## 2022-01-02 ENCOUNTER — Telehealth: Payer: Self-pay

## 2022-01-02 NOTE — Telephone Encounter (Signed)
Patients daughter in law Basilia Jumbo has asked to schedule her mother in laws office visit.  Lattie Haw has been asked to schedule appt with [facility] for her mother in law since her mother in law has given permission to schedule.  Thanks,  [place], [place]

## 2022-02-01 ENCOUNTER — Encounter: Payer: Self-pay | Admitting: Family Medicine

## 2022-05-07 ENCOUNTER — Ambulatory Visit: Payer: Medicare Other | Admitting: Gastroenterology

## 2022-05-07 ENCOUNTER — Encounter: Payer: Self-pay | Admitting: Gastroenterology

## 2022-05-07 NOTE — Progress Notes (Deleted)
Primary Care Physician: Freddy Finner, NP  Primary Gastroenterologist:  Dr. Lucilla Lame  No chief complaint on file.   HPI: Jasmine Gardner is a 72 y.o. female here with a report of a colonoscopy back in 2022 by Dr. Bonna Gains with a poor prep and a repeat colonoscopy recommended in 3 months.  It does not appear the patient proceeded with that repeat colonoscopy.  Back in November the patient was referred for abdominal pain and bloating.  At that time it was recommended that the patient undergo a repeat colonoscopy due to the previous poor prep.  The month prior to that the patient was sent for a consultation for the abdominal bloating with generalized abdominal pain.  It was reported that the patient had been having worsening abdominal pain and the bloating was new with a CT scan being normal except diverticulosis and a small hiatal hernia.  The patient was started on dicyclomine and a PPI and sent for consideration for possible EGD and colonoscopy.  Past Medical History:  Diagnosis Date   Arthritis    Cancer ([place])    breast   Depression    GERD (gastroesophageal reflux disease)    Headache    Hypertension    Personal history of radiation therapy    Post-menopausal    UTI (urinary tract infection)     Current Outpatient Medications  Medication Sig Dispense Refill   alendronate (FOSAMAX) 70 MG tablet Take 1 tablet (70 mg total) by mouth once a week. Take with a full glass of water on an empty stomach. 12 tablet 3   amLODipine (NORVASC) 5 MG tablet Take 5 mg by mouth daily.     bisoprolol-hydrochlorothiazide (ZIAC) 10-6.25 MG tablet Take 1 tablet by mouth 2 (two) times daily.     hydrOXYzine (VISTARIL) 25 MG capsule Take 25 mg by mouth as needed. (Patient not taking: Reported on 11/19/2019)     ibuprofen (ADVIL) 800 MG tablet Take 1 tablet (800 mg total) by mouth every 8 (eight) hours as needed. (Patient not taking: Reported on 12/22/2021) 30 tablet 3   lisinopril  (PRINIVIL,ZESTRIL) 40 MG tablet Take 40 mg by mouth at bedtime.     losartan (COZAAR) 100 MG tablet Take 100 mg by mouth daily.     methocarbamol (ROBAXIN) 500 MG tablet Take 1 tablet (500 mg total) by mouth in the morning and at bedtime. (Patient not taking: Reported on 12/22/2021) 90 tablet 0   omeprazole (PRILOSEC) 20 MG capsule Take 20 mg by mouth daily.      pravastatin (PRAVACHOL) 20 MG tablet Take 20 mg by mouth at bedtime.     sertraline (ZOLOFT) 25 MG tablet Take 25 mg by mouth daily. (Patient not taking: Reported on 05/11/2021)     No current facility-administered medications for this visit.    Allergies as of 05/07/2022   (No Known Allergies)    ROS:  General: Negative for anorexia, weight loss, fever, chills, fatigue, weakness. ENT: Negative for hoarseness, difficulty swallowing , nasal congestion. CV: Negative for chest pain, angina, palpitations, dyspnea on exertion, peripheral edema.  Respiratory: Negative for dyspnea at rest, dyspnea on exertion, cough, sputum, wheezing.  GI: See history of present illness. GU:  Negative for dysuria, hematuria, urinary incontinence, urinary frequency, nocturnal urination.  Endo: Negative for unusual weight change.    Physical Examination:   There were no vitals taken for this visit.  General: Well-nourished, well-developed in no acute distress.  Eyes: No icterus. Conjunctivae pink. Lungs: Clear  to auscultation bilaterally. Non-labored. Heart: Regular rate and rhythm, no murmurs rubs or gallops.  Abdomen: Bowel sounds are normal, nontender, nondistended, no hepatosplenomegaly or masses, no abdominal bruits or hernia , no rebound or guarding.   Extremities: No lower extremity edema. No clubbing or deformities. Neuro: Alert and oriented x 3.  Grossly intact. Skin: Warm and dry, no jaundice.   Psych: Alert and cooperative, normal mood and affect.  Labs:    Imaging Studies: No results found.  Assessment and Plan:   Jasmine Gardner is a 72 y.o. y/o female ***     Lucilla Lame, MD. Marval Regal    Note: This dictation was prepared with Dragon dictation along with smaller phrase technology. Any transcriptional errors that result from this process are unintentional.

## 2022-08-14 ENCOUNTER — Ambulatory Visit (INDEPENDENT_AMBULATORY_CARE_PROVIDER_SITE_OTHER): Payer: Medicare Other | Admitting: Gastroenterology

## 2022-08-14 ENCOUNTER — Encounter: Payer: Self-pay | Admitting: Gastroenterology

## 2022-08-14 VITALS — BP 152/90 | HR 82 | Temp 97.9°F | Ht 59.0 in | Wt 158.0 lb

## 2022-08-14 DIAGNOSIS — Z1211 Encounter for screening for malignant neoplasm of colon: Secondary | ICD-10-CM

## 2022-08-14 MED ORDER — NA SULFATE-K SULFATE-MG SULF 17.5-3.13-1.6 GM/177ML PO SOLN: 1.0000 | Freq: Once | ORAL | 0 refills | Status: AC

## 2022-08-14 NOTE — Patient Instructions (Signed)
2 cajas de Bowel Prep: Suprep   2 das antes del procedimiento-   13 de junio de 2024 No ingiera alimentos slidos ni productos lcteos de ningn tipo despus de las 12 del [place].   Entre el medioda y las 4 p. m.: beba 1 botella de Suprep en el vaso provisto, llnelo Elaina Hoops cubrir con agua fra.  Tome [employer] medicamentos recetados (excepto el hierro o cualquier otro medicamento que haya dejado de tomar)   Un da antes del procedimiento.     A las 5 p. m.: beba 1 botella de Suprep en el vaso provisto, llnelo hasta cubrir con agua fra.  5 horas antes del procedimiento: beba 1 botella de Suprep en el vaso provisto, llnela hasta cubrir con agua fra.  Te quedar 1 botella en el kit de preparacin.

## 2022-08-14 NOTE — Progress Notes (Signed)
Primary Care Physician: Sandrea Hughs, NP  Primary Gastroenterologist:  Dr. [name]  Chief Complaint  Patient presents with   Abdominal Pain   Bloated   Colonoscopy    Repeat due to poor prep from previous    HPI: Jasmine Gardner is a 72 y.o. female here with history of bloating and generalized abdominal pain.  The patient had a colonoscopy by Dr. Maximino Greenland back in 2022 and at that time the patient had a poor prep and was recommended to have a repeat prep in 3 months with a 2-day prep.  It does not appear that the patient had the repeat exam.  The patient had a CT scan of the abdomen at the latter part of last year that showed:  IMPRESSION: 1. Colonic diverticulosis with no acute diverticulitis. 2. Tiny hiatal hernia.  The patient follows up with oncology for a history of breast cancer. The pain is in the groin on the left. She does have constipation. On further questioning through an interpreter the patient denies any abdominal pain and states that her pain is in the left groin.  She says that it radiates to her back on the left.  There is no report of any pain or discomfort getting better or worse with eating drinking or moving her bowels.  She has had chronic constipation and this is not something new.  He reports that her pain is made better by over-the-counter anti-inflammatory medication.  Past Medical History:  Diagnosis Date   Arthritis    Cancer ([facility])    breast   Depression    GERD (gastroesophageal reflux disease)    Headache    Hypertension    Personal history of radiation therapy    Post-menopausal    UTI (urinary tract infection)     Current Outpatient Medications  Medication Sig Dispense Refill   alendronate (FOSAMAX) 70 MG tablet Take 1 tablet (70 mg total) by mouth once a week. Take with a full glass of water on an empty stomach. 12 tablet 3   amLODipine (NORVASC) 5 MG tablet Take 5 mg by mouth daily.     metFORMIN (GLUCOPHAGE-XR) 500 MG 24 hr  tablet Take 500 mg by mouth daily.     omeprazole (PRILOSEC) 20 MG capsule Take 20 mg by mouth daily.      pravastatin (PRAVACHOL) 20 MG tablet Take 20 mg by mouth at bedtime.     lisinopril (PRINIVIL,ZESTRIL) 40 MG tablet Take 40 mg by mouth at bedtime.     losartan (COZAAR) 100 MG tablet Take 100 mg by mouth daily.     No current facility-administered medications for this visit.    Allergies as of 08/14/2022   (No Known Allergies)    ROS:  General: Negative for anorexia, weight loss, fever, chills, fatigue, weakness. ENT: Negative for hoarseness, difficulty swallowing , nasal congestion. CV: Negative for chest pain, angina, palpitations, dyspnea on exertion, peripheral edema.  Respiratory: Negative for dyspnea at rest, dyspnea on exertion, cough, sputum, wheezing.  GI: See history of present illness. GU:  Negative for dysuria, hematuria, urinary incontinence, urinary frequency, nocturnal urination.  Endo: Negative for unusual weight change.    Physical Examination:   BP (!) 152/90 (BP Location: Right Arm, Patient Position: Sitting, Cuff Size: Normal)   Pulse 82   Temp 97.9 F (36.6 C) (Oral)   Ht 4\' 11"  (1.499 m)   Wt 158 lb (71.7 kg)   BMI 31.91 kg/m   General: Well-nourished, well-developed in no  acute distress.  Eyes: No icterus. Conjunctivae pink. Lungs: Clear to auscultation bilaterally. Non-labored. Heart: Regular rate and rhythm, no murmurs rubs or gallops.  Abdomen: Bowel sounds are normal, nontender, nondistended, no hepatosplenomegaly or masses, no abdominal bruits or hernia , no rebound or guarding.   Extremities: No lower extremity edema. No clubbing or deformities. Neuro: Alert and oriented x 3.  Grossly intact. Skin: Warm and dry, no jaundice.   Psych: Alert and cooperative, normal mood and affect.  Labs:    Imaging Studies: No results found.  Assessment and Plan:   Jasmine Gardner is a 72 y.o. y/o female who comes in today with a report of  abdominal pain which is really groin pain not associate with any GI symptoms.  The patient's pain is better with anti-inflammatory medication.  The patient has been told that this groin pain is musculoskeletal in nature and not related to her GI tract.  The patient is due for screening colonoscopy because of a poor prep during her last colonoscopy.  The patient will be set up for screening colonoscopy.  The patient has been explained the plan and agrees with it.     [name], MD. Clementeen Graham    Note: This dictation was prepared with Dragon dictation along with smaller phrase technology. Any transcriptional errors that result from this process are unintentional.

## 2022-08-15 MED ORDER — NA SULFATE-K SULFATE-MG SULF 17.5-3.13-1.6 GM/177ML PO SOLN: 1.0000 | Freq: Once | ORAL | 0 refills | Status: AC

## 2022-08-15 NOTE — Addendum Note (Signed)
Addended by: Roena Malady on: 08/15/2022 08:22 AM   Modules accepted: Orders

## 2022-09-04 ENCOUNTER — Encounter: Payer: Self-pay | Admitting: Gastroenterology

## 2022-09-05 NOTE — Anesthesia Preprocedure Evaluation (Addendum)
Anesthesia Evaluation  Patient identified by MRN, date of birth, ID band Patient awake    Reviewed: Allergy & Precautions, H&P , NPO status , Patient's Chart, lab work & pertinent test results  Airway Mallampati: III  TM Distance: >3 FB Neck ROM: Full    Dental no notable dental hx.    Pulmonary neg pulmonary ROS   Pulmonary exam normal breath sounds clear to auscultation       Cardiovascular hypertension, Normal cardiovascular exam Rhythm:Regular Rate:Normal     Neuro/Psych  Headaches PSYCHIATRIC DISORDERS  Depression    negative neurological ROS  negative psych ROS   GI/Hepatic negative GI ROS, Neg liver ROS,GERD  ,,  Endo/Other  negative endocrine ROS    Renal/GU negative Renal ROS  negative genitourinary   Musculoskeletal negative musculoskeletal ROS (+) Arthritis ,    Abdominal   Peds negative pediatric ROS (+)  Hematology negative hematology ROS (+)   Anesthesia Other Findings Hypertension  Depression GERD (gastroesophageal reflux disease)  Arthritis UTI (urinary tract infection)  Post-menopausal Headache  History left breast cancer Personal history of radiation therapy     Reproductive/Obstetrics negative OB ROS                             Anesthesia Physical Anesthesia Plan  ASA: 3  Anesthesia Plan: General   Post-op Pain Management:    Induction: Intravenous  PONV Risk Score and Plan:   Airway Management Planned: Natural Airway and Nasal Cannula  Additional Equipment:   Intra-op Plan:   Post-operative Plan:   Informed Consent: I have reviewed the patients History and Physical, chart, labs and discussed the procedure including the risks, benefits and alternatives for the proposed anesthesia with the patient or authorized representative who has indicated his/her understanding and acceptance.     Dental Advisory Given  Plan Discussed with:  Anesthesiologist, CRNA and Surgeon  Anesthesia Plan Comments: (Patient consented for risks of anesthesia including but not limited to:  - adverse reactions to medications - risk of airway placement if required - damage to eyes, teeth, lips or other oral mucosa - nerve damage due to positioning  - sore throat or hoarseness - Damage to heart, brain, nerves, lungs, other parts of body or loss of life  Patient voiced understanding.)       Anesthesia Quick Evaluation

## 2022-09-06 ENCOUNTER — Telehealth: Payer: Self-pay

## 2022-09-06 NOTE — Telephone Encounter (Signed)
I spoke to daughter Clotilde Dieter, she states they have the packet from the OV given in [place]... Wanted to confirm prep instructions... She also reports pt has not taken Metformin and had clear liquid today  Nothing further needed

## 2022-09-06 NOTE — Telephone Encounter (Signed)
Patient is calling with the spanish interpreter  is calling because they do not have any instructions for her colonoscopy tomorrow. I do not see any instructions under letter is chart. They said they are confused about the prep they received from the pharmacy. Patient gave authorization for you to call her Granddaughter Clotilde Dieter on the number that was provided. Please call patient back today colonoscopy is tomorrow.

## 2022-09-07 ENCOUNTER — Encounter
Admission: RE | Disposition: A | Discharge status: Home / Self Care | Payer: Self-pay | Source: Home / Self Care | Attending: Gastroenterology

## 2022-09-07 ENCOUNTER — Ambulatory Visit: Payer: Medicare Other | Admitting: Anesthesiology

## 2022-09-07 ENCOUNTER — Ambulatory Visit
Admission: RE | Admit: 2022-09-07 | Discharge: 2022-09-07 | Disposition: A | Discharge status: Home / Self Care | Payer: Medicare Other | Attending: Gastroenterology | Admitting: Gastroenterology

## 2022-09-07 ENCOUNTER — Encounter: Payer: Self-pay | Admitting: Gastroenterology

## 2022-09-07 ENCOUNTER — Other Ambulatory Visit: Payer: Self-pay

## 2022-09-07 DIAGNOSIS — K640 First degree hemorrhoids: Secondary | ICD-10-CM | POA: Insufficient documentation

## 2022-09-07 DIAGNOSIS — Z1211 Encounter for screening for malignant neoplasm of colon: Secondary | ICD-10-CM | POA: Diagnosis present

## 2022-09-07 DIAGNOSIS — K219 Gastro-esophageal reflux disease without esophagitis: Secondary | ICD-10-CM | POA: Diagnosis not present

## 2022-09-07 DIAGNOSIS — Z853 Personal history of malignant neoplasm of breast: Secondary | ICD-10-CM | POA: Diagnosis not present

## 2022-09-07 DIAGNOSIS — K5730 Diverticulosis of large intestine without perforation or abscess without bleeding: Secondary | ICD-10-CM | POA: Insufficient documentation

## 2022-09-07 DIAGNOSIS — Z923 Personal history of irradiation: Secondary | ICD-10-CM | POA: Insufficient documentation

## 2022-09-07 DIAGNOSIS — I10 Essential (primary) hypertension: Secondary | ICD-10-CM | POA: Insufficient documentation

## 2022-09-07 DIAGNOSIS — M1990 Unspecified osteoarthritis, unspecified site: Secondary | ICD-10-CM | POA: Diagnosis not present

## 2022-09-07 HISTORY — DX: Personal history of malignant neoplasm of breast: Z85.3

## 2022-09-07 HISTORY — PX: COLONOSCOPY WITH PROPOFOL: SHX5780

## 2022-09-07 SURGERY — COLONOSCOPY WITH PROPOFOL
Anesthesia: General | Site: Rectum

## 2022-09-07 MED ORDER — ONDANSETRON HCL 4 MG/2ML IJ SOLN
INTRAMUSCULAR | Status: DC | PRN
  Administered 2022-09-07: 4 mg via INTRAVENOUS

## 2022-09-07 MED ORDER — GLYCOPYRROLATE 0.2 MG/ML IJ SOLN
INTRAMUSCULAR | Status: DC | PRN
  Administered 2022-09-07: .2 mg via INTRAVENOUS

## 2022-09-07 MED ORDER — STERILE WATER FOR IRRIGATION IR SOLN
Status: DC | PRN
  Administered 2022-09-07: 150 mL

## 2022-09-07 MED ORDER — LACTATED RINGERS IV SOLN: INTRAVENOUS | Status: DC

## 2022-09-07 MED ORDER — LIDOCAINE HCL (CARDIAC) PF 100 MG/5ML IV SOSY
PREFILLED_SYRINGE | INTRAVENOUS | Status: DC | PRN
  Administered 2022-09-07: 40 mg via INTRAVENOUS

## 2022-09-07 MED ORDER — SODIUM CHLORIDE 0.9 % IV SOLN: INTRAVENOUS | Status: DC

## 2022-09-07 MED ORDER — PROPOFOL 10 MG/ML IV BOLUS
INTRAVENOUS | Status: DC | PRN
  Administered 2022-09-07: 100 ug/kg/min via INTRAVENOUS
  Administered 2022-09-07: 20 mg via INTRAVENOUS

## 2022-09-07 SURGICAL SUPPLY — 21 items

## 2022-09-07 NOTE — H&P (Signed)
[name], MD [facility] [address] [place], [place] [address] Phone: [phone] Fax : [phone]  Primary Care Physician:  Sandrea Hughs, NP Primary Gastroenterologist:  Dr. Servando Snare  Pre-Procedure History & Physical: HPI:  Jasmine Gardner is a 72 y.o. female is here for a screening colonoscopy.   Past Medical History:  Diagnosis Date   Arthritis    Cancer (HCC)    breast   Depression    GERD (gastroesophageal reflux disease)    Headache    History of left breast cancer    Hypertension    Personal history of radiation therapy    Post-menopausal    UTI (urinary tract infection)     Past Surgical History:  Procedure Laterality Date   BREAST BIOPSY Left 11/08/2016   bx +   BREAST EXCISIONAL BIOPSY Left 11/24/2016   lumpectomy with rad   BREAST LUMPECTOMY Left 11/24/2016   COLONOSCOPY  2007   COLONOSCOPY WITH PROPOFOL N/A 01/06/2021   Procedure: COLONOSCOPY WITH PROPOFOL;  Surgeon: Pasty Spillers, MD;  Location: [facility] END[facility];  Service: Endoscopy;  Laterality: N/A;   PARTIAL MASTECTOMY WITH NEEDLE LOCALIZATION Left 11/25/2015   Procedure: PARTIAL MASTECTOMY WITH NEEDLE LOCALIZATION;  Surgeon: Nadeen Landau, MD;  Location: [facility] ORS;  Service: General;  Laterality: Left;   SENTINEL NODE BIOPSY Left 11/25/2015   Procedure: SENTINEL NODE BIOPSY;  Surgeon: Nadeen Landau, MD;  Location: [facility] OR[facility];  Service: General;  Laterality: Left;   TUBAL LIGATION      Prior to Admission medications   Medication Sig Start Date End Date Taking? Authorizing Provider  amLODipine (NORVASC) 5 MG tablet Take 10 mg by mouth daily.   Yes [provider]  diclofenac (VOLTAREN) 50 MG EC tablet Take 50 mg by mouth daily as needed.   Yes [provider]  losartan (COZAAR) 100 MG tablet Take 100 mg by mouth daily.   Yes [provider]  Multiple Vitamin (MULTIVITAMIN) tablet Take 1 tablet by mouth daily.   Yes [provider]  pravastatin  (PRAVACHOL) 20 MG tablet Take 20 mg by mouth at bedtime.   Yes [provider]  alendronate (FOSAMAX) 70 MG tablet Take 1 tablet (70 mg total) by mouth once a week. Take with a full glass of water on an empty stomach. Patient not taking: Reported on 09/04/2022 05/11/21   Jeralyn Ruths, MD  lisinopril (PRINIVIL,ZESTRIL) 40 MG tablet Take 40 mg by mouth at bedtime. Patient not taking: Reported on 09/04/2022    [provider]  metFORMIN (GLUCOPHAGE-XR) 500 MG 24 hr tablet Take 500 mg by mouth daily. Patient not taking: Reported on 09/04/2022 04/11/22   [provider]  omeprazole (PRILOSEC) 20 MG capsule Take 20 mg by mouth daily.  Patient not taking: Reported on 09/04/2022    [provider]    Allergies as of 08/14/2022   (No Known Allergies)    Family History  Problem Relation Age of Onset   Bladder Cancer Neg Hx    Kidney cancer Neg Hx     Social History   Socioeconomic History   Marital status: Single    Spouse name: Not on file   Number of children: Not on file   Years of education: Not on file   Highest education level: Not on file  Occupational History   Not on file  Tobacco Use   Smoking status: Never   Smokeless tobacco: Never  Vaping Use   Vaping Use: Never used  Substance and Sexual Activity   Alcohol use: No   Drug use: No   Sexual activity: Not on file  Other Topics Concern   Not on file  Social History Narrative   Not on file   Social Determinants of Health   Financial Resource Strain: Not on file  Food Insecurity: Not on file  Transportation Needs: Not on file  Physical Activity: Not on file  Stress: Not on file  Social Connections: Not on file  Intimate Partner Violence: Not on file    Review of Systems: See HPI, otherwise negative ROS  Physical Exam: BP 118/76   Temp 97.8 F (36.6 C) (Temporal)   Resp (!) 24   Ht 4\' 11"  (1.499 m)   Wt 68.9 kg   SpO2 95%   BMI 30.66 kg/m  General:   Alert,   pleasant and cooperative in NAD Head:  Normocephalic and atraumatic. Neck:  Supple; no masses or thyromegaly. Lungs:  Clear throughout to auscultation.    Heart:  Regular rate and rhythm. Abdomen:  Soft, nontender and nondistended. Normal bowel sounds, without guarding, and without rebound.   Neurologic:  Alert and  oriented x4;  grossly normal neurologically.  Impression/Plan: Jasmine Gardner is now here to undergo a screening colonoscopy.  Risks, benefits, and alternatives regarding colonoscopy have been reviewed with the patient.  Questions have been answered.  All parties agreeable.

## 2022-09-07 NOTE — Transfer of Care (Signed)
Immediate Anesthesia Transfer of Care Note  Patient: Jasmine Gardner  Procedure(s) Performed: COLONOSCOPY WITH PROPOFOL (Rectum)  Patient Location: PACU  Anesthesia Type: General  Level of Consciousness: awake, alert  and patient cooperative  Airway and Oxygen Therapy: Patient Spontanous Breathing and Patient connected to supplemental oxygen  Post-op Assessment: Post-op Vital signs reviewed, Patient's Cardiovascular Status Stable, Respiratory Function Stable, Patent Airway and No signs of Nausea or vomiting  Post-op Vital Signs: Reviewed and stable  Complications: No notable events documented.

## 2022-09-07 NOTE — Anesthesia Postprocedure Evaluation (Signed)
Anesthesia Post Note  Patient: Jasmine Gardner  Procedure(s) Performed: COLONOSCOPY WITH PROPOFOL (Rectum)  Patient location during evaluation: PACU Anesthesia Type: General Level of consciousness: awake and alert Pain management: pain level controlled Vital Signs Assessment: post-procedure vital signs reviewed and stable Respiratory status: spontaneous breathing, nonlabored ventilation, respiratory function stable and patient connected to nasal cannula oxygen Cardiovascular status: blood pressure returned to baseline and stable Postop Assessment: no apparent nausea or vomiting Anesthetic complications: no   No notable events documented.   Last Vitals:  Vitals:   09/07/22 0853 09/07/22 0858  BP: 99/66 114/69  Pulse: 93 87  Resp: 18 20  Temp:  (!) 36.1 C  SpO2: 95% 97%    Last Pain:  Vitals:   09/07/22 0858  TempSrc:   PainSc: 0-No pain                 Marisue Humble

## 2022-09-07 NOTE — Op Note (Signed)
[facility] Patient Name: Jasmine Gardner Procedure Date: 09/07/2022 7:14 AM MRN: [id number] Account #: [id number] Date of Birth: Feb 03, 1951 Admit Type: Outpatient Age: 72 Room: [facility] OR ROOM 01 Gender: Female Note Status: Finalized Instrument Name: [id number] Procedure:             Colonoscopy Indications:           Screening for colorectal malignant neoplasm Providers:             [name] MD, MD Referring MD:          Dorcas Mcmurray (Referring MD) Medicines:             Propofol per Anesthesia Complications:         No immediate complications. Procedure:             Pre-Anesthesia Assessment:                        - Prior to the procedure, a History and Physical was                         performed, and patient medications and allergies were                         reviewed. The patient's tolerance of previous                         anesthesia was also reviewed. The risks and benefits                         of the procedure and the sedation options and risks                         were discussed with the patient. All questions were                         answered, and informed consent was obtained. Prior                         Anticoagulants: The patient has taken no anticoagulant                         or antiplatelet agents. ASA Grade Assessment: II - A                         patient with mild systemic disease. After reviewing                         the risks and benefits, the patient was deemed in                         satisfactory condition to undergo the procedure.                        - Prior to the procedure, a History and Physical was                         performed, and patient medications and allergies were  reviewed. The patient's tolerance of previous                         anesthesia was also reviewed. The risks and benefits                         of the procedure and the sedation options  and risks                         were discussed with the patient. All questions were                         answered, and informed consent was obtained. Prior                         Anticoagulants: The patient has taken no anticoagulant                         or antiplatelet agents. ASA Grade Assessment: II - A                         patient with mild systemic disease. After reviewing                         the risks and benefits, the patient was deemed in                         satisfactory condition to undergo the procedure.                        After obtaining informed consent, the colonoscope was                         passed under direct vision. Throughout the procedure,                         the patient's blood pressure, pulse, and oxygen                         saturations were monitored continuously. The                         Colonoscope was introduced through the anus and                         advanced to the the cecum, identified by appendiceal                         orifice and ileocecal valve. The colonoscopy was                         performed without difficulty. The patient tolerated                         the procedure well. The quality of the bowel                         preparation was excellent. Findings:  The perianal and digital rectal examinations were normal.      Non-bleeding internal hemorrhoids were found during retroflexion. The       hemorrhoids were Grade I (internal hemorrhoids that do not prolapse).      Multiple small-mouthed diverticula were found in the sigmoid colon. Impression:            - Non-bleeding internal hemorrhoids.                        - Diverticulosis in the sigmoid colon.                        - No specimens collected. Recommendation:        - Discharge patient to home.                        - Resume previous diet.                        - Continue present medications.                        - Repeat colonoscopy  in 10 years for screening                         purposes. Procedure Code(s):     --- Professional ---                        [phone], Colonoscopy, flexible; diagnostic, including                         collection of specimen(s) by brushing or washing, when                         performed (separate procedure) Diagnosis Code(s):     --- Professional ---                        Z12.11, Encounter for screening for malignant neoplasm                         of colon CPT copyright 2022 American Medical Association. All rights reserved. The codes documented in this report are preliminary and upon coder review may  be revised to meet current compliance requirements. [name] MD, MD 09/07/2022 8:48:51 AM This report has been signed electronically. Number of Addenda: 0 Note Initiated On: 09/07/2022 7:14 AM Scope Withdrawal Time: 0 hours 8 minutes 11 seconds  Total Procedure Duration: 0 hours 12 minutes 50 seconds  Estimated Blood Loss:  Estimated blood loss: none.      [facility]

## 2022-09-10 ENCOUNTER — Encounter: Payer: Self-pay | Admitting: Gastroenterology

## 2022-10-16 ENCOUNTER — Other Ambulatory Visit: Payer: Self-pay | Admitting: Oncology

## 2022-10-16 DIAGNOSIS — M810 Age-related osteoporosis without current pathological fracture: Secondary | ICD-10-CM

## 2022-12-20 ENCOUNTER — Ambulatory Visit
Admission: RE | Admit: 2022-12-20 | Discharge: 2022-12-20 | Disposition: A | Discharge status: Home / Self Care | Payer: Medicare Other | Source: Ambulatory Visit | Attending: Nurse Practitioner | Admitting: Nurse Practitioner

## 2022-12-20 DIAGNOSIS — Z08 Encounter for follow-up examination after completed treatment for malignant neoplasm: Secondary | ICD-10-CM | POA: Insufficient documentation

## 2022-12-20 DIAGNOSIS — Z1231 Encounter for screening mammogram for malignant neoplasm of breast: Secondary | ICD-10-CM | POA: Diagnosis present

## 2022-12-20 DIAGNOSIS — Z853 Personal history of malignant neoplasm of breast: Secondary | ICD-10-CM | POA: Diagnosis present

## 2022-12-24 ENCOUNTER — Ambulatory Visit: Payer: Medicare Other | Admitting: Nurse Practitioner

## 2022-12-24 ENCOUNTER — Inpatient Hospital Stay: Payer: Medicare Other | Admitting: Oncology

## 2023-01-23 ENCOUNTER — Ambulatory Visit
Admission: RE | Admit: 2023-01-23 | Discharge: 2023-01-23 | Disposition: A | Discharge status: Home / Self Care | Payer: Medicare Other | Source: Ambulatory Visit | Attending: Oncology | Admitting: Oncology

## 2023-01-23 DIAGNOSIS — M810 Age-related osteoporosis without current pathological fracture: Secondary | ICD-10-CM | POA: Insufficient documentation

## 2023-01-28 ENCOUNTER — Encounter: Payer: Self-pay | Admitting: Oncology

## 2023-01-28 ENCOUNTER — Inpatient Hospital Stay: Payer: Medicare Other | Attending: Nurse Practitioner | Admitting: Oncology

## 2023-02-18 ENCOUNTER — Encounter: Payer: Self-pay | Admitting: Obstetrics and Gynecology

## 2023-04-03 ENCOUNTER — Telehealth: Payer: Self-pay | Admitting: Obstetrics

## 2023-04-03 NOTE — Telephone Encounter (Signed)
 Reached out to pt to reschedule 2/7 appt with Quitman Livings, bc a longer time slot is needed for the appt.  (Interpreter is being used.  40 minute slot is needed.)

## 2023-04-08 ENCOUNTER — Other Ambulatory Visit: Payer: Self-pay | Admitting: Primary Care

## 2023-04-08 DIAGNOSIS — R102 Pelvic and perineal pain: Secondary | ICD-10-CM

## 2023-04-16 ENCOUNTER — Ambulatory Visit
Admission: RE | Admit: 2023-04-16 | Discharge: 2023-04-16 | Disposition: A | Discharge status: Home / Self Care | Payer: Medicare Other | Source: Ambulatory Visit | Attending: Primary Care | Admitting: Primary Care

## 2023-04-16 DIAGNOSIS — R102 Pelvic and perineal pain: Secondary | ICD-10-CM | POA: Diagnosis present

## 2023-04-17 NOTE — Telephone Encounter (Signed)
Due to rescheduled appointment time has been adjusted for reflect 40 minutes

## 2023-05-03 ENCOUNTER — Encounter: Payer: Medicare Other | Admitting: Obstetrics

## 2023-05-06 ENCOUNTER — Encounter: Payer: Self-pay | Admitting: Obstetrics

## 2023-05-28 ENCOUNTER — Encounter: Payer: Self-pay | Admitting: Obstetrics and Gynecology

## 2023-06-10 ENCOUNTER — Encounter: Payer: Self-pay | Admitting: Obstetrics and Gynecology

## 2023-06-14 ENCOUNTER — Other Ambulatory Visit (HOSPITAL_COMMUNITY)
Admission: RE | Admit: 2023-06-14 | Discharge: 2023-06-14 | Disposition: A | Discharge status: Home / Self Care | Source: Ambulatory Visit | Attending: Advanced Practice Midwife | Admitting: Advanced Practice Midwife

## 2023-06-14 ENCOUNTER — Ambulatory Visit (INDEPENDENT_AMBULATORY_CARE_PROVIDER_SITE_OTHER): Admitting: Advanced Practice Midwife

## 2023-06-14 ENCOUNTER — Encounter: Payer: Self-pay | Admitting: Advanced Practice Midwife

## 2023-06-14 VITALS — BP 145/78 | HR 88 | Ht 61.0 in | Wt 158.0 lb

## 2023-06-14 DIAGNOSIS — N952 Postmenopausal atrophic vaginitis: Secondary | ICD-10-CM

## 2023-06-14 DIAGNOSIS — N898 Other specified noninflammatory disorders of vagina: Secondary | ICD-10-CM | POA: Insufficient documentation

## 2023-06-14 DIAGNOSIS — R102 Pelvic and perineal pain: Secondary | ICD-10-CM | POA: Diagnosis not present

## 2023-06-14 DIAGNOSIS — N7011 Chronic salpingitis: Secondary | ICD-10-CM | POA: Diagnosis not present

## 2023-06-14 NOTE — Progress Notes (Signed)
 Patient ID: Jasmine Gardner, female   DOB: [birth year], 73 y.o.   MRN: [id number]  Reason for Consult: Referral (On/off vaginal irritation, on/off left side pelvic pain)   Referred by Sandrea Hughs, NP  Subjective:  Interpreter present for visit  HPI:  Jasmine Gardner is a 73 y.o. female being seen on the advice of her PCP to discuss the need for surgery based on her recent pelvic ultrasound. She reports the pain started over a year ago. It comes and goes- once a week or so. It is left sided and described as stabbing. She also experiences vaginal pain that is sharp and tingling every once in a while- sometimes associated with the pelvic pain. No symptoms today. She reports history of UTI and she has a concern for pelvic organ prolapse as she feels like her uterus is near her introitus.  Obstetric History: G4 P4, home births History of ER/PR positive breast cancer  I discussed the patient with Dr Lonny Prude who recommends she schedule an appointment to plan surgery if needed. I recommended follow up with MD also to evaluate mild cystocele. Aptima vaginitis done today due to c/o vaginal itching. Discussed possibly related to postmenopausal thinning of vaginal tissue.  CLINICAL DATA:  Pelvic pain   EXAM: TRANSABDOMINAL AND TRANSVAGINAL ULTRASOUND OF PELVIS   TECHNIQUE: Both transabdominal and transvaginal ultrasound examinations of the pelvis were performed. Transabdominal technique was performed for global imaging of the pelvis including uterus, ovaries, adnexal regions, and pelvic cul-de-sac. It was necessary to proceed with endovaginal exam following the transabdominal exam to visualize the uterus and adnexa.   COMPARISON:  CT 06/24/2016   FINDINGS: Uterus   Measurements: 5.8 x 3 x 5 cm = volume: 46 mL. No fibroids or other mass visualized.   Endometrium   Thickness: 1.5 mm. Moderate to large amount of fluid within the endometrial canal   Right ovary   Not discretely  visualized. Dilated tubular structure in the right adnexa suspicious for hydrosalpinx   Left ovary   Not discretely visualized. Dilated tubular structure in the left adnexa suspicious for hydrosalpinx.   Other findings   No abnormal free fluid.   IMPRESSION: 1. Moderate to large amount of fluid within the endometrial canal, cervical obstructing process is a consideration. Correlate with direct inspection 2. Dilated tubular structures in the adnexa bilaterally suspicious for bilateral hydrosalpinx. Nonvisualized ovaries   Electronically Signed   By: Jasmine Pang M.D.   On: 04/19/2023 00:08   Past Medical History:  Diagnosis Date   Arthritis    Cancer (HCC)    breast   Depression    GERD (gastroesophageal reflux disease)    Headache    History of left breast cancer    Hypertension    Personal history of radiation therapy    Post-menopausal    UTI (urinary tract infection)    Family History  Problem Relation Age of Onset   Bladder Cancer Neg Hx    Kidney cancer Neg Hx    Breast cancer Neg Hx    Past Surgical History:  Procedure Laterality Date   BREAST BIOPSY Left 11/08/2016   bx +   BREAST EXCISIONAL BIOPSY Left 11/24/2016   lumpectomy with rad   BREAST LUMPECTOMY Left 11/24/2016   COLONOSCOPY  2007   COLONOSCOPY WITH PROPOFOL N/A 01/06/2021   Procedure: COLONOSCOPY WITH PROPOFOL;  Surgeon: Pasty Spillers, MD;  Location: [facility] END[facility];  Service: Endoscopy;  Laterality: N/A;   COLONOSCOPY WITH PROPOFOL N/A 09/07/2022  Procedure: COLONOSCOPY WITH PROPOFOL;  Surgeon: [name], MD;  Location: [facility];  Service: Endoscopy;  Laterality: N/A;   PARTIAL MASTECTOMY WITH NEEDLE LOCALIZATION Left 11/25/2015   Procedure: PARTIAL MASTECTOMY WITH NEEDLE LOCALIZATION;  Surgeon: Nadeen Landau, MD;  Location: [facility] OR[facility];  Service: General;  Laterality: Left;   SENTINEL NODE BIOPSY Left 11/25/2015   Procedure: SENTINEL NODE BIOPSY;  Surgeon: Nadeen Landau, MD;  Location: [facility] OR[facility];  Service: General;  Laterality: Left;   TUBAL LIGATION      Short Social History:  Social History   Tobacco Use   Smoking status: Never   Smokeless tobacco: Never  Substance Use Topics   Alcohol use: No    No Known Allergies  Current Outpatient Medications  Medication Sig Dispense Refill   acetaminophen (TYLENOL) 500 MG tablet      amLODipine (NORVASC) 10 MG tablet Take 10 mg by mouth daily.     clobetasol cream (TEMOVATE) 0.05 % Apply 1 Application topically 2 (two) times daily.     losartan (COZAAR) 100 MG tablet Take 100 mg by mouth daily.     meloxicam (MOBIC) 15 MG tablet Take 15 mg by mouth daily.     Multiple Vitamin (MULTIVITAMIN) tablet Take 1 tablet by mouth daily.     pravastatin (PRAVACHOL) 20 MG tablet Take 20 mg by mouth at bedtime.     triamcinolone (NASACORT) 55 MCG/ACT AERO nasal inhaler SMARTSIG:Both Nares     omeprazole (PRILOSEC) 20 MG capsule Take 20 mg by mouth daily.  (Patient not taking: Reported on 09/04/2022)     No current facility-administered medications for this visit.    Review of Systems  Constitutional:  Negative for chills and fever.  HENT:  Positive for congestion. Negative for ear discharge, ear pain, hearing loss, sinus pain and sore throat.   Eyes:  Negative for blurred vision and double vision.  Respiratory:  Negative for cough, shortness of breath and wheezing.   Cardiovascular:  Negative for chest pain, palpitations and leg swelling.  Gastrointestinal:  Positive for constipation. Negative for abdominal pain, blood in stool, diarrhea, heartburn, melena, nausea and vomiting.  Genitourinary:  Negative for dysuria, flank pain, frequency, hematuria and urgency.       Positive for vaginal itching  Musculoskeletal:  Positive for joint pain. Negative for back pain and myalgias.  Skin:  Negative for itching and rash.  Neurological:  Positive for tingling. Negative for dizziness, tremors, sensory change,  speech change, focal weakness, seizures, loss of consciousness, weakness and headaches.  Endo/Heme/Allergies:  Positive for environmental allergies. Does not bruise/bleed easily.  Psychiatric/Behavioral:  Positive for depression. Negative for hallucinations, memory loss, substance abuse and suicidal ideas. The patient is not nervous/anxious and does not have insomnia.        Objective:  Objective   Vitals:   06/14/23 1101 06/14/23 1202  BP: (!) 141/85 (!) 145/78  Pulse: 95 88  Weight: 158 lb (71.7 kg)   Height: 5\' 1"  (1.549 m)    Body mass index is 29.85 kg/m. Constitutional: Well nourished, well developed female in no acute distress.  HEENT: normal Skin: Warm and dry.  Cardiovascular: Regular rate and rhythm.   Extremity: no edema Respiratory: Clear to auscultation bilateral. Normal respiratory effort Psych: Alert and Oriented x3. No memory deficits. Normal mood and affect.   Pelvic exam: (female chaperone present) is not limited by body habitus EGBUS: within normal limits Vagina: mild prolapse of bladder with bearing down, min/mod muscle strength Cervix:  no CMT   Assessment/Plan:     72 y.o. G4 P4 consult regarding need for surgery, pelvic pain (hydrosalpinx) vaginal pain/itch (atrophy vs yeast vs radiating), mild cystocele  Continue using Clobetasol as needed for vaginal itching (avoid estrogen due to breast cancer history) Follow up with MD Logan Bores regarding need for surgery (hydrosalpinx) and treatment for cystocele   Tresea Mall, CNM [facility] 06/14/2023 6:53 PM

## 2023-06-14 NOTE — Patient Instructions (Signed)
 Disfuncin del suelo plvico en las mujeres Pelvic Floor Dysfunction, Female  La disfuncin del suelo plvico (DSP) es una afeccin que se produce cuando el grupo de msculos y tejidos conjuntivos que sostienen los rganos de la pelvis (msculos del suelo plvico) no funcionan bien. Estos msculos y sus conexiones forman un cabestrillo que sostiene el colon y la vejiga. En las mujeres, tambin sostienen el tero. La DSP hace que los msculos del suelo plvico se debiliten mucho, se tensionen mucho, o ambas cosas. Cuando existe una DSP, los movimientos de los msculos no son coordinados. Esta afeccin puede causar problemas en los intestinos o la vejiga. Tambin puede Programmer, multimedia. Cules son las causas? La causa de esta afeccin puede ser una lesin en la zona plvica o un debilitamiento de los msculos plvicos. Esto suele ser el resultado del [place] y [place] o de otros tipos de esfuerzos. En muchos casos, se desconoce la causa exacta. Qu incrementa el riesgo? Los siguientes factores pueden hacer que sea ms propensa a Aeronautical engineer afeccin: Tener una afeccin de inflamacin crnica del tejido de la vejiga (cistitis intersticial). Ser Neomia Dear persona de edad avanzada. Tener sobrepeso. Haber recibido tratamiento de radiacin para el cncer en la zona plvica. Tener cirugas plvicas previas, como la extirpacin del tero (histerectoma). Cules son los signos o sntomas? Los sntomas de esta afeccin varan y pueden incluir lo siguiente: Sntomas de la vejiga, tales como los siguientes: Dificultad para comenzar a Geographical information systems officer y para Arboriculturist. Infeccin de las vas urinarias frecuentes. Prdidas de orina al toser, rer o realizar actividad fsica (incontinencia urinaria por esfuerzo). Necesidad de Geographical information systems officer con frecuencia o [place]. Dolor al Beatrix Shipper. Sntomas de los intestinos, tales como los siguientes: Estreimiento. Deposiciones urgentes o frecuentes. Deposiciones  incompletas. Tener dolor al defecar. Prdida de heces o gases. Dolor genital o rectal sin causa aparente. Espasmos musculares genitales o rectales. Dolor lumbar. Otros sntomas pueden incluir lo siguiente: Sensacin de Public librarian vagina pesada, llena o con dolor. Un bulto que protruye hacia dentro de la vagina. Dolor durante o despus del sexo. Cmo se diagnostica? Esta afeccin se puede diagnosticar en funcin de lo siguiente: Los sntomas y los antecedentes mdicos. Un examen fsico. Durante el examen, el mdico puede revisar los msculos de la pelvis para determinar tensin, espasmos, dolor o debilidad. Esto puede incluir un examen rectal y un examen plvico. En algunos casos, es posible que le hagan pruebas de diagnstico, por ejemplo: Pruebas elctricas del funcionamiento muscular. Pruebas del flujo de la [place]. Radiografas del funcionamiento intestinal. Ecografa de los rganos de la pelvis. Cmo se trata? El tratamiento de esta afeccin depende de los sntomas. Las opciones de tratamiento incluyen las siguientes: Fisioterapia. Esto puede incluir ejercicios de [place] para ayudar a International aid/development worker o a fortalecer los msculos del suelo plvico. Biorretroalimentacin. Este tipo de terapia proporciona retroalimentacin sobre qu tan tensionados estn los msculos del suelo plvico de modo que pueda aprender a controlarlos. Masajes internos o externos. Un tratamiento que consiste en la estimulacin elctrica de los msculos del suelo plvico para ayudar a Human resources officer (estimulacin nerviosa elctrica transcutnea o ENET). Tratamientos con ondas de sonido (ecografa) para reducir los [employer]. Medicamentos, como por ejemplo: Relajantes musculares. Medicamentos para el control de la vejiga. La ciruga para Designer, jewellery o [employer] msculos del suelo plvico puede ser una opcin si otros tratamientos no son eficaces. Siga estas instrucciones en su casa: Actividad Haga sus  actividades habituales como se lo haya indicado el mdico. Pregntele al  mdico si debe modificar algunas actividades. Realice los ejercicios de fortalecimiento o relajacin del suelo plvico en su casa como se lo haya indicado el fisioterapeuta. Estilo de vida Mantenga un peso saludable. Consuma alimentos ricos en fibra, como frijoles, cereales integrales, y frutas y verduras frescas. Limite su consumo de alimentos ricos en grasa y azcares procesados, como los alimentos fritos o dulces. Controle el estrs con tcnicas de Microbiologist, como yoga o meditacin. Instrucciones generales Si tiene problemas de prdidas: Use toallitas higinicas absorbibles o use ropa interior acolchada. Lvese con frecuencia con [place]. Mantenga la zona genital y anal lo ms limpia y seca posible. Consulte a su mdico si debera probar usar una crema protectora para prevenir la irritacin de la piel. Tome baos de agua tibia para aliviar la tensin muscular en la pelvis o los espasmos. Use los medicamentos de venta libre y los recetados solamente como se lo haya indicado el mdico. Concurra a todas las visitas de seguimiento. Cmo se previene? La causa de la DSP no siempre se conoce, pero hay algunas cosas que puede hacer para reducir Nurse, adult de desarrollar esta afeccin, entre ellas: Mantener un peso saludable. Practicar actividad fsica con regularidad. Controlar el estrs. Comunquese con un mdico si: Los sntomas no mejoran con el Theatre stage manager. Tiene signos o sntomas de que la disfuncin del suelo plvico empeora en su casa. Presenta algn signo o sntoma nuevo. Tiene signos de infeccin de las vas urinarias, como los siguientes: [place]. Escalofros. Aumento de la frecuencia urinaria. Sensacin de ardor al [employer]. No ha tenido deposiciones en 3 das (estreimiento). Resumen La disfuncin del suelo plvico se produce cuando los msculos y los tejidos conjuntivos en el suelo plvico no  funcionan bien. Estos msculos y sus conexiones forman un cabestrillo que sostiene el colon y la vejiga. En las mujeres, tambin sostienen el tero. La causa de la disfuncin del suelo plvico puede ser una lesin en la zona plvica o un debilitamiento de los msculos plvicos. La disfuncin del suelo plvico hace que los msculos del suelo plvico se debiliten mucho, se tensionen mucho, o ambas cosas. Los sntomas pueden variar de Neomia Dear persona a Educational psychologist. En la [employer], la disfuncin del suelo plvico puede tratarse con fisioterapia y medicamentos. La ciruga puede ser una opcin si otros tratamientos no resultan eficaces. Esta informacin no tiene Theme park manager el consejo del mdico. Asegrese de hacerle al mdico cualquier pregunta que tenga. Document Revised: 08/11/2020 Document Reviewed: 08/11/2020 Elsevier Patient Education  2024 [employer].

## 2023-06-17 LAB — CERVICOVAGINAL ANCILLARY ONLY
Bacterial Vaginitis (gardnerella): NEGATIVE
Candida Glabrata: NEGATIVE
Candida Vaginitis: NEGATIVE
Comment: NEGATIVE
Comment: NEGATIVE
Comment: NEGATIVE

## 2023-07-03 ENCOUNTER — Encounter: Payer: Self-pay | Admitting: Obstetrics and Gynecology

## 2023-07-03 ENCOUNTER — Ambulatory Visit (INDEPENDENT_AMBULATORY_CARE_PROVIDER_SITE_OTHER): Admitting: Obstetrics and Gynecology

## 2023-07-03 VITALS — BP 121/72 | HR 99 | Ht 60.0 in | Wt 159.6 lb

## 2023-07-03 DIAGNOSIS — N7011 Chronic salpingitis: Secondary | ICD-10-CM | POA: Diagnosis not present

## 2023-07-03 DIAGNOSIS — N952 Postmenopausal atrophic vaginitis: Secondary | ICD-10-CM | POA: Diagnosis not present

## 2023-07-03 NOTE — Progress Notes (Signed)
 HPI:      [name] is a 73 y.o. G4P4 who LMP was No LMP recorded. Patient is postmenopausal.  Subjective:   She presents today because more than 2 months ago she had pelvic/abdominal pain.  An ultrasound revealed fluid in her fallopian tube and uterine cavity.  She reports that she has not had pain in over 2 months.  She reports that she is not having any other pelvic symptoms including vaginal dryness itching or urinary issues.  She has stated that she does not wish to have any kind of surgery unless it is absolutely necessary. Significant note, patient has had some type of a tubal ligation procedure after previous childbirth. She has also had breast cancer.    Hx: The following portions of the patient's history were reviewed and updated as appropriate:             She  has a past medical history of Arthritis, Cancer (HCC), Depression, GERD (gastroesophageal reflux disease), Headache, History of left breast cancer, Hypertension, Personal history of radiation therapy, Post-menopausal, and UTI (urinary tract infection). She does not have any pertinent problems on file. She  has a past surgical history that includes Tubal ligation; Colonoscopy (2007); Partial mastectomy with needle localization (Left, 11/25/2015); Sentinel node biopsy (Left, 11/25/2015); Breast biopsy (Left, 11/08/2016); Breast excisional biopsy (Left, 11/24/2016); Breast lumpectomy (Left, 11/24/2016); Colonoscopy with propofol (N/A, 01/06/2021); and Colonoscopy with propofol (N/A, 09/07/2022). Her family history is not on file. She  reports that she has never smoked. She has never used smokeless tobacco. She reports that she does not drink alcohol and does not use drugs. She has a current medication list which includes the following prescription(s): acetaminophen, amlodipine, clobetasol cream, losartan, meloxicam, multivitamin, pravastatin, triamcinolone, and omeprazole. She has no known allergies.       Review of Systems:   Review of Systems  Constitutional: Denied constitutional symptoms, night sweats, recent illness, fatigue, fever, insomnia and weight loss.  Eyes: Denied eye symptoms, eye pain, photophobia, vision change and visual disturbance.  Ears/Nose/Throat/Neck: Denied ear, nose, throat or neck symptoms, hearing loss, nasal discharge, sinus congestion and sore throat.  Cardiovascular: Denied cardiovascular symptoms, arrhythmia, chest pain/pressure, edema, exercise intolerance, orthopnea and palpitations.  Respiratory: Denied pulmonary symptoms, asthma, pleuritic pain, productive sputum, cough, dyspnea and wheezing.  Gastrointestinal: Denied, gastro-esophageal reflux, melena, nausea and vomiting.  Genitourinary: Denied genitourinary symptoms including symptomatic vaginal discharge, pelvic relaxation issues, and urinary complaints.  Musculoskeletal: Denied musculoskeletal symptoms, stiffness, swelling, muscle weakness and myalgia.  Dermatologic: Denied dermatology symptoms, rash and scar.  Neurologic: Denied neurology symptoms, dizziness, headache, neck pain and syncope.  Psychiatric: Denied psychiatric symptoms, anxiety and depression.  Endocrine: Denied endocrine symptoms including hot flashes and night sweats.   Meds:   Current Outpatient Medications on File Prior to Visit  Medication Sig Dispense Refill   acetaminophen (TYLENOL) 500 MG tablet      amLODipine (NORVASC) 10 MG tablet Take 10 mg by mouth daily.     clobetasol cream (TEMOVATE) 0.05 % Apply 1 Application topically 2 (two) times daily.     losartan (COZAAR) 100 MG tablet Take 100 mg by mouth daily.     meloxicam (MOBIC) 15 MG tablet Take 15 mg by mouth daily.     Multiple Vitamin (MULTIVITAMIN) tablet Take 1 tablet by mouth daily.     pravastatin (PRAVACHOL) 20 MG tablet Take 20 mg by mouth at bedtime.     triamcinolone (NASACORT) 55 MCG/ACT AERO nasal inhaler SMARTSIG:Both Nares  omeprazole (PRILOSEC) 20 MG capsule Take 20 mg by  mouth daily.  (Patient not taking: Reported on 09/04/2022)     No current facility-administered medications on file prior to visit.      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 Hydrosalpinx   2. Vaginal atrophy     Patient currently asymptomatic from hydrosalpinx-fluid collection as well as cystocele. She does not wish to be examined today.   Plan:            1.  We have discussed hydrosalpinx.  Possibility of tubal blockage from tubal ligation and cervical blockage could cause this.  Small absolute risk of tubal cancer discussed. Patient currently asymptomatic so the hydrosalpinx may have resolved. Cystocele has never been an issue for her We will follow-up with ultrasound and decide management after ultrasound results return. Orders Orders Placed This Encounter  Procedures   US PELVIS (TRANSABDOMINAL ONLY)    No orders of the defined types were placed in this encounter.     F/U  Return for We will contact her with any abnormal test results.  Elonda Husky, M.D. 07/03/2023 11:15 AM

## 2023-07-03 NOTE — Progress Notes (Signed)
 Patient presents today to discuss surgical options regarding cystocele and hydrosalpinx. She states no pain or swelling in the past two months, would like to discuss if surgery if needed or not as she does not wish to have surgery.

## 2023-07-07 ENCOUNTER — Encounter: Payer: Self-pay | Admitting: Advanced Practice Midwife

## 2023-07-30 ENCOUNTER — Ambulatory Visit

## 2023-07-30 ENCOUNTER — Other Ambulatory Visit: Payer: Self-pay | Admitting: Obstetrics and Gynecology

## 2023-07-30 DIAGNOSIS — N7011 Chronic salpingitis: Secondary | ICD-10-CM | POA: Diagnosis not present

## 2023-07-30 DIAGNOSIS — N952 Postmenopausal atrophic vaginitis: Secondary | ICD-10-CM

## 2023-08-06 ENCOUNTER — Ambulatory Visit (INDEPENDENT_AMBULATORY_CARE_PROVIDER_SITE_OTHER): Admitting: Obstetrics and Gynecology

## 2023-08-06 ENCOUNTER — Encounter: Payer: Self-pay | Admitting: Obstetrics and Gynecology

## 2023-08-06 ENCOUNTER — Ambulatory Visit: Payer: Self-pay

## 2023-08-06 VITALS — BP 147/82 | HR 85 | Ht 60.0 in | Wt 160.6 lb

## 2023-08-06 DIAGNOSIS — N3289 Other specified disorders of bladder: Secondary | ICD-10-CM

## 2023-08-06 DIAGNOSIS — N7011 Chronic salpingitis: Secondary | ICD-10-CM | POA: Diagnosis not present

## 2023-08-06 NOTE — Progress Notes (Signed)
 Patient presents today to review recent ultrasound results.

## 2023-08-06 NOTE — Progress Notes (Signed)
 HPI:      [name] is a 73 y.o. G4P4 who LMP was No LMP recorded. Patient is postmenopausal.  Subjective:   She presents today as a follow-up ultrasound has been performed for her pelvic pain.  She reports her pain is much improved.  Still has pain in the morning. After discussing her most recent ultrasound she does confirm that she feels as if she has trouble emptying her bladder on a regular basis and that it still feels full even though she has tried to empty it.    Hx: The following portions of the patient's history were reviewed and updated as appropriate:             She  has a past medical history of Arthritis, Cancer (HCC), Depression, GERD (gastroesophageal reflux disease), Headache, History of left breast cancer, Hypertension, Personal history of radiation therapy, Post-menopausal, and UTI (urinary tract infection). She does not have any pertinent problems on file. She  has a past surgical history that includes Tubal ligation; Colonoscopy (2007); Partial mastectomy with needle localization (Left, 11/25/2015); Sentinel node biopsy (Left, 11/25/2015); Breast biopsy (Left, 11/08/2016); Breast excisional biopsy (Left, 11/24/2016); Breast lumpectomy (Left, 11/24/2016); Colonoscopy with propofol  (N/A, 01/06/2021); and Colonoscopy with propofol  (N/A, 09/07/2022). Her family history is not on file. She  reports that she has never smoked. She has never used smokeless tobacco. She reports that she does not drink alcohol and does not use drugs. She has a current medication list which includes the following prescription(s): acetaminophen , amlodipine, clobetasol cream, losartan, meloxicam, multivitamin, omeprazole, pravastatin, and triamcinolone. She has no known allergies.       Review of Systems:  Review of Systems  Constitutional: Denied constitutional symptoms, night sweats, recent illness, fatigue, fever, insomnia and weight loss.  Eyes: Denied eye symptoms, eye pain, photophobia,  vision change and visual disturbance.  Ears/Nose/Throat/Neck: Denied ear, nose, throat or neck symptoms, hearing loss, nasal discharge, sinus congestion and sore throat.  Cardiovascular: Denied cardiovascular symptoms, arrhythmia, chest pain/pressure, edema, exercise intolerance, orthopnea and palpitations.  Respiratory: Denied pulmonary symptoms, asthma, pleuritic pain, productive sputum, cough, dyspnea and wheezing.  Gastrointestinal: Denied, gastro-esophageal reflux, melena, nausea and vomiting.  Genitourinary: See HPI for additional information.  Musculoskeletal: Denied musculoskeletal symptoms, stiffness, swelling, muscle weakness and myalgia.  Dermatologic: Denied dermatology symptoms, rash and scar.  Neurologic: Denied neurology symptoms, dizziness, headache, neck pain and syncope.  Psychiatric: Denied psychiatric symptoms, anxiety and depression.  Endocrine: Denied endocrine symptoms including hot flashes and night sweats.   Meds:   Current Outpatient Medications on File Prior to Visit  Medication Sig Dispense Refill   acetaminophen  (TYLENOL ) 500 MG tablet      amLODipine (NORVASC) 10 MG tablet Take 10 mg by mouth daily.     clobetasol cream (TEMOVATE) 0.05 % Apply 1 Application topically 2 (two) times daily.     losartan (COZAAR) 100 MG tablet Take 100 mg by mouth daily.     meloxicam (MOBIC) 15 MG tablet Take 15 mg by mouth daily.     Multiple Vitamin (MULTIVITAMIN) tablet Take 1 tablet by mouth daily.     omeprazole (PRILOSEC) 20 MG capsule Take 20 mg by mouth daily.     pravastatin (PRAVACHOL) 20 MG tablet Take 20 mg by mouth at bedtime.     triamcinolone (NASACORT) 55 MCG/ACT AERO nasal inhaler SMARTSIG:Both Nares     No current facility-administered medications on file prior to visit.      Objective:  Vitals:   08/06/23 1440  BP: (!) 147/82  Pulse: 85   Filed Weights   08/06/23 1440  Weight: 160 lb 9.6 oz (72.8 kg)              Review of ultrasound shows  resolution of hydrosalpinx!!          Enlarged bladder remains present even post void.           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 Hydrosalpinx   2. Bladder hypertrophy     Hydrosalpinx resolved              Likely urinary retention based on symptoms and enlarged bladder found pre and post void   Plan:            1.  Expectant management of pelvic pain -I believe her issues with hydrosalpinx are resolved  2.  Referral to urology for urinary retention-enlarged bladder. Orders Orders Placed This Encounter  Procedures   Ambulatory referral to Urology    No orders of the defined types were placed in this encounter.     F/U  No follow-ups on file.  Delice Felt, M.D. 08/06/2023 3:01 PM

## 2023-09-04 ENCOUNTER — Ambulatory Visit (INDEPENDENT_AMBULATORY_CARE_PROVIDER_SITE_OTHER): Admitting: Urology

## 2023-09-04 VITALS — BP 166/84 | HR 96 | Ht 60.0 in | Wt 163.0 lb

## 2023-09-04 DIAGNOSIS — R399 Unspecified symptoms and signs involving the genitourinary system: Secondary | ICD-10-CM

## 2023-09-04 DIAGNOSIS — N952 Postmenopausal atrophic vaginitis: Secondary | ICD-10-CM

## 2023-09-04 LAB — URINALYSIS, COMPLETE
Bilirubin, UA: NEGATIVE
Glucose, UA: NEGATIVE
Ketones, UA: NEGATIVE
Leukocytes,UA: NEGATIVE
Nitrite, UA: NEGATIVE
Protein,UA: NEGATIVE
Specific Gravity, UA: <1.005 — ABNORMAL LOW (ref 1.005–1.030)
Urobilinogen, Ur: 0.2 mg/dL (ref 0.2–1.0)
pH, UA: 6 (ref 5.0–7.5)

## 2023-09-04 LAB — MICROSCOPIC EXAMINATION: Bacteria, UA: NONE SEEN

## 2023-09-04 MED ORDER — TAMSULOSIN HCL 0.4 MG PO CAPS: 0.4000 mg | ORAL_CAPSULE | Freq: Every day | ORAL | 1 refills | Status: AC

## 2023-09-04 NOTE — Progress Notes (Addendum)
 I, Maysun LITTIE Griffiths, acting as a scribe for Glendia JAYSON Barba, MD., have documented all relevant documentation on the behalf of Glendia JAYSON Barba, MD, as directed by Glendia JAYSON Barba, MD while in the presence of Glendia JAYSON Barba, MD.  09/04/2023 4:00 PM   Jasmine Gardner 1950-06-19 [id number]  Referring provider: Janit Alm Agent, MD [address] [place],  [place] [address]  Chief Complaint  Patient presents with   Urinary Frequency    HPI: Jasmine Gardner is a 73 y.o. female referred for bladder hypertrophy.  Complains of urinary hesitancy and weak urinary stream, and when straining to urinate will have urgency with pain. Duration for several months Had bladder ultrasound with her gynecology appointment, and bladder appeared to be enlarged, though a post-void residual was only 40 cc.  Denies recurrent UTIs or gross hematuria.   PMH: Past Medical History:  Diagnosis Date   Arthritis    Cancer (HCC)    breast   Depression    GERD (gastroesophageal reflux disease)    Headache    History of left breast cancer    Hypertension    Personal history of radiation therapy    Post-menopausal    UTI (urinary tract infection)     Surgical History: Past Surgical History:  Procedure Laterality Date   BREAST BIOPSY Left 11/08/2016   bx +   BREAST EXCISIONAL BIOPSY Left 11/24/2016   lumpectomy with rad   BREAST LUMPECTOMY Left 11/24/2016   COLONOSCOPY  2007   COLONOSCOPY WITH PROPOFOL  N/A 01/06/2021   Procedure: COLONOSCOPY WITH PROPOFOL ;  Surgeon: Janalyn Keene NOVAK, MD;  Location: [facility];  Service: Endoscopy;  Laterality: N/A;   COLONOSCOPY WITH PROPOFOL  N/A 09/07/2022   Procedure: COLONOSCOPY WITH PROPOFOL ;  Surgeon: Jinny Carmine, MD;  [facility];  Service: Endoscopy;  Laterality: N/A;   PARTIAL MASTECTOMY WITH NEEDLE LOCALIZATION Left 11/25/2015   Procedure: PARTIAL MASTECTOMY WITH NEEDLE LOCALIZATION;  Surgeon: Larinda Unknown Sharps, MD;   Location: [facility];  Service: General;  Laterality: Left;   SENTINEL NODE BIOPSY Left 11/25/2015   Procedure: SENTINEL NODE BIOPSY;  Surgeon: Larinda Unknown Sharps, MD;  Location: [facility];  Service: General;  Laterality: Left;   TUBAL LIGATION      Home Medications:  Allergies as of 09/04/2023   No Known Allergies      Medication List        Accurate as of September 04, 2023  4:00 PM. If you have any questions, ask your nurse or doctor.          acetaminophen  500 MG tablet Commonly known as: TYLENOL    amLODipine 10 MG tablet Commonly known as: NORVASC Take 10 mg by mouth daily.   clobetasol cream 0.05 % Commonly known as: TEMOVATE Apply 1 Application topically 2 (two) times daily.   losartan 100 MG tablet Commonly known as: COZAAR Take 100 mg by mouth daily.   meloxicam 15 MG tablet Commonly known as: MOBIC Take 15 mg by mouth daily.   multivitamin tablet Take 1 tablet by mouth daily.   omeprazole 20 MG capsule Commonly known as: PRILOSEC Take 20 mg by mouth daily.   pravastatin 20 MG tablet Commonly known as: PRAVACHOL Take 20 mg by mouth at bedtime.   tamsulosin  0.4 MG Caps capsule Commonly known as: FLOMAX  Take 1 capsule (0.4 mg total) by mouth daily.   triamcinolone 55 MCG/ACT Aero nasal inhaler Commonly known as: NASACORT SMARTSIG:Both Nares  Allergies: No Known Allergies  Family History: Family History  Problem Relation Age of Onset   Bladder Cancer Neg Hx    Kidney cancer Neg Hx    Breast cancer Neg Hx     Social History:  reports that she has never smoked. She has never used smokeless tobacco. She reports that she does not drink alcohol and does not use drugs.   Physical Exam: BP (!) 166/84   Pulse 96   Ht 5' (1.524 m)   Wt 163 lb (73.9 kg)   BMI 31.83 kg/m   Constitutional:  Alert and oriented, No acute distress. HEENT: [place] AT Respiratory: Normal respiratory effort, no increased work of breathing. Psychiatric: Normal mood and  affect.   Urinalysis Dipstick trace blood/microscopy negative.   Pertinent Imaging: CT abdomen pelvis from August 2023 was reviewed. The bladder wall has not thickened, but the bladder is somewhat enlarged.    Assessment & Plan:    1. Lower urinary tract symptoms Primarily obstruct voiding symptoms, however does have urgency. towards the end of urination She is adequately emptying as PVR today was 32 mL.  Trial of tamsulosin  4 mg daily.  Follow up 1 month for symptom recheck, and if still symptomatic, will perform cystoscopy at that visit. Pelvic exam at time of cystoscopy.  I have reviewed the above documentation for accuracy and completeness, and I agree with the above.   Glendia JAYSON Barba, MD  [facility] [address], [address] [place], [place] [address] [phone]

## 2023-09-06 ENCOUNTER — Encounter: Payer: Self-pay | Admitting: Urology

## 2023-10-07 ENCOUNTER — Ambulatory Visit (INDEPENDENT_AMBULATORY_CARE_PROVIDER_SITE_OTHER): Admitting: Urology

## 2023-10-07 ENCOUNTER — Encounter: Payer: Self-pay | Admitting: Urology

## 2023-10-07 VITALS — BP 127/74 | HR 68 | Ht 60.0 in | Wt 163.0 lb

## 2023-10-07 DIAGNOSIS — R399 Unspecified symptoms and signs involving the genitourinary system: Secondary | ICD-10-CM | POA: Diagnosis not present

## 2023-10-07 LAB — MICROSCOPIC EXAMINATION
Epithelial Cells (non renal): >10 /HPF — AB (ref 0–10)
WBC, UA: >30 /HPF — AB (ref 0–5)

## 2023-10-07 LAB — URINALYSIS, COMPLETE
Bilirubin, UA: NEGATIVE
Glucose, UA: NEGATIVE
Ketones, UA: NEGATIVE
Nitrite, UA: NEGATIVE
Protein,UA: NEGATIVE
Specific Gravity, UA: 1.01 (ref 1.005–1.030)
Urobilinogen, Ur: 0.2 mg/dL (ref 0.2–1.0)
pH, UA: 6 (ref 5.0–7.5)

## 2023-10-07 NOTE — Progress Notes (Signed)
   10/07/23  CC:  Chief Complaint  Patient presents with   Cysto    HPI: Refer to my prior office note 09/04/2023.  She started tamsulosin  and noted marked improvement in her urinary symptoms.  Blood pressure 127/74, pulse 68, height 5' (1.524 m), weight 163 lb (73.9 kg). NED. A&Ox3.   No respiratory distress   Abd soft, NT, ND Normal external genitalia with patent urethral meatus  Cystoscopy Procedure Note  Patient identification was confirmed, informed consent was obtained, and patient was prepped using Betadine solution.  Lidocaine  jelly was administered per urethral meatus.    Procedure: - Flexible cystoscope introduced, without any difficulty.   - Thorough search of the bladder revealed:    normal urethral meatus    normal urothelium    no stones    no ulcers     no tumors    no urethral polyps    no trabeculation  - Ureteral orifices were normal in position and appearance.  Post-Procedure: - Patient tolerated the procedure well   Assessment/ Plan: No abnormalities on cystoscopy Has seen significant improvement in her voiding symptoms on tamsulosin  which she may continue.  Refill was sent to pharmacy 1 year follow-up with PA for symptom check and PVR    Glendia JAYSON Barba, MD

## 2023-11-21 ENCOUNTER — Other Ambulatory Visit: Payer: Self-pay | Admitting: Primary Care

## 2023-11-21 DIAGNOSIS — Z1231 Encounter for screening mammogram for malignant neoplasm of breast: Secondary | ICD-10-CM

## 2024-01-07 ENCOUNTER — Ambulatory Visit
Admission: RE | Admit: 2024-01-07 | Discharge: 2024-01-07 | Disposition: A | Discharge status: Home / Self Care | Source: Ambulatory Visit | Attending: Primary Care | Admitting: Primary Care

## 2024-01-07 DIAGNOSIS — Z1231 Encounter for screening mammogram for malignant neoplasm of breast: Secondary | ICD-10-CM | POA: Diagnosis present

## 2024-10-06 ENCOUNTER — Ambulatory Visit: Admitting: Physician Assistant
